# Patient Record
Sex: Male | Born: 1950 | Race: White | Hispanic: No | Marital: Married | Smoking: Never smoker
Health system: Southern US, Community
[De-identification: ages and names within clinical notes are randomized; demographics above are authoritative.]

## PROBLEM LIST (undated history)

## (undated) DIAGNOSIS — I10 Essential (primary) hypertension: Secondary | ICD-10-CM

## (undated) DIAGNOSIS — M549 Dorsalgia, unspecified: Secondary | ICD-10-CM

## (undated) DIAGNOSIS — E785 Hyperlipidemia, unspecified: Secondary | ICD-10-CM

## (undated) DIAGNOSIS — N400 Enlarged prostate without lower urinary tract symptoms: Secondary | ICD-10-CM

## (undated) DIAGNOSIS — Z973 Presence of spectacles and contact lenses: Secondary | ICD-10-CM

## (undated) DIAGNOSIS — E039 Hypothyroidism, unspecified: Secondary | ICD-10-CM

## (undated) DIAGNOSIS — J309 Allergic rhinitis, unspecified: Principal | ICD-10-CM

## (undated) DIAGNOSIS — N486 Induration penis plastica: Secondary | ICD-10-CM

## (undated) HISTORY — DX: Allergic rhinitis, unspecified: J30.9

## (undated) HISTORY — DX: Hyperlipidemia, unspecified: E78.5

## (undated) HISTORY — PX: INGUINAL HERNIA REPAIR: SUR1180

## (undated) HISTORY — DX: Essential (primary) hypertension: I10

---

## 2004-08-28 ENCOUNTER — Ambulatory Visit: Payer: Self-pay | Admitting: Family Medicine

## 2004-10-09 ENCOUNTER — Ambulatory Visit: Payer: Self-pay | Admitting: Family Medicine

## 2005-01-21 ENCOUNTER — Ambulatory Visit: Payer: Self-pay | Admitting: Family Medicine

## 2005-04-08 ENCOUNTER — Ambulatory Visit: Payer: Self-pay | Admitting: Family Medicine

## 2005-07-29 ENCOUNTER — Ambulatory Visit: Payer: Self-pay | Admitting: *Deleted

## 2005-07-29 ENCOUNTER — Ambulatory Visit: Payer: Self-pay | Admitting: Family Medicine

## 2005-11-25 ENCOUNTER — Ambulatory Visit: Payer: Self-pay | Admitting: Nurse Practitioner

## 2005-12-16 ENCOUNTER — Ambulatory Visit: Payer: Self-pay | Admitting: Family Medicine

## 2006-01-20 ENCOUNTER — Ambulatory Visit: Payer: Self-pay | Admitting: Family Medicine

## 2006-03-31 ENCOUNTER — Ambulatory Visit: Payer: Self-pay | Admitting: Family Medicine

## 2007-02-23 ENCOUNTER — Encounter (INDEPENDENT_AMBULATORY_CARE_PROVIDER_SITE_OTHER): Payer: Self-pay | Admitting: *Deleted

## 2009-08-06 LAB — HM COLONOSCOPY: HM Colonoscopy: NORMAL

## 2010-02-06 ENCOUNTER — Encounter: Payer: Self-pay | Admitting: Internal Medicine

## 2010-02-06 ENCOUNTER — Ambulatory Visit: Payer: Self-pay | Admitting: Internal Medicine

## 2010-02-06 DIAGNOSIS — N4889 Other specified disorders of penis: Secondary | ICD-10-CM

## 2010-02-06 DIAGNOSIS — I10 Essential (primary) hypertension: Secondary | ICD-10-CM | POA: Insufficient documentation

## 2010-02-06 DIAGNOSIS — E785 Hyperlipidemia, unspecified: Secondary | ICD-10-CM

## 2010-02-06 LAB — CONVERTED CEMR LAB
Albumin: 4.4 g/dL (ref 3.5–5.2)
Alkaline Phosphatase: 61 units/L (ref 39–117)
Basophils Absolute: 0 10*3/uL (ref 0.0–0.1)
Basophils Relative: 0.5 % (ref 0.0–3.0)
CO2: 30 meq/L (ref 19–32)
Chloride: 103 meq/L (ref 96–112)
Cholesterol: 169 mg/dL (ref 0–200)
Eosinophils Absolute: 0.2 10*3/uL (ref 0.0–0.7)
Glucose, Bld: 93 mg/dL (ref 70–99)
HCT: 44 % (ref 39.0–52.0)
HDL: 34.1 mg/dL — ABNORMAL LOW (ref >39.00)
Hemoglobin: 15.2 g/dL (ref 13.0–17.0)
Ketones, ur: NEGATIVE mg/dL
Leukocytes, UA: NEGATIVE
Lymphocytes Relative: 21.7 % (ref 12.0–46.0)
Lymphs Abs: 1.5 10*3/uL (ref 0.7–4.0)
MCHC: 34.7 g/dL (ref 30.0–36.0)
MCV: 89.3 fL (ref 78.0–100.0)
Neutro Abs: 4.7 10*3/uL (ref 1.4–7.7)
PSA: 0.63 ng/mL (ref 0.10–4.00)
Potassium: 4.4 meq/L (ref 3.5–5.1)
RBC: 4.92 M/uL (ref 4.22–5.81)
RDW: 13.7 % (ref 11.5–14.6)
Sodium: 141 meq/L (ref 135–145)
Specific Gravity, Urine: 1.025 (ref 1.000–1.030)
TSH: 3.33 microintl units/mL (ref 0.35–5.50)
Total Protein: 7.3 g/dL (ref 6.0–8.3)
Urine Glucose: NEGATIVE mg/dL
Urobilinogen, UA: 1 (ref 0.0–1.0)
VLDL: 36.2 mg/dL (ref 0.0–40.0)
pH: 6 (ref 5.0–8.0)

## 2010-02-14 ENCOUNTER — Encounter (INDEPENDENT_AMBULATORY_CARE_PROVIDER_SITE_OTHER): Payer: Self-pay | Admitting: *Deleted

## 2010-04-09 ENCOUNTER — Encounter: Payer: Self-pay | Admitting: Internal Medicine

## 2010-07-09 NOTE — Consult Note (Signed)
Summary: [facility]  [facility]   Imported By: Lester Edgemont 04/15/2010 08:00:45  _____________________________________________________________________  External Attachment:    Type:   Image     Comment:   External Document

## 2010-07-09 NOTE — Letter (Signed)
Summary: [facility] Consult Scheduled Letter  [facility]  [address] [place], [place] [address]   Phone: [phone]  Fax: [phone]      02/14/2010 MRN: [id number]  JAD JOHANSSON [address] [place], [place]  [address]    Dear Mr. Agnes,      We have scheduled an appointment for you.  At the recommendation of Dr.John, we have scheduled you a consult with Dr Vernie Ammons on 03/17/10 at 3:15pm.  Their phone number is [phone].  If this appointment day and time is not convenient for you, please feel free to call the office of the doctor you are being referred to at the number listed above and reschedule the appointment.    [facility] [address] [place], [place] [address]    Thank you,  Patient Care Coordinator [facility]

## 2010-07-09 NOTE — Assessment & Plan Note (Signed)
Summary: NEW PT FOR CPX/BCBS PER WIFE/#/CD   Vital Signs:  Patient profile:   60 year old male Height:      67 inches Weight:      166.50 pounds BMI:     26.17 O2 Sat:      96 % on Room air Temp:     97.7 degrees F oral Pulse rate:   68 / minute BP sitting:   115 / 80  (left arm) Cuff size:   regular  Vitals Entered By: Zella Ball Ewing CMA Duncan Dull) (February 06, 2010 8:48 AM)  O2 Flow:  Room air  Preventive Care Screening  Colonoscopy:    Date:  08/06/2009    Next Due:  08/2019    Results:  normal   Last Tetanus Booster:    Date:  02/06/2010    Results:  Tdap  CC: new Pt. Adult Physical/RE   CC:  new Pt. Adult Physical/RE.  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  Also mentions some notice of increased forgetfulness recently, but o/w cognition intact .  Overall good complaicne iwth meds, good tolerability, but does not want to take cialis anymore.   Preventive Screening-Counseling & Management  Alcohol-Tobacco     Smoking Status: never      Drug Use:  no.    Problems Prior to Update: None  Medications Prior to Update: 1)  None  Current Medications (verified): 1)  Levothyroxine Sodium 50 Mcg Tabs (Levothyroxine Sodium) .Marland Kitchen.. 1 By Mouth Once Daily 2)  Simvastatin 40 Mg Tabs (Simvastatin) .Marland Kitchen.. 1 By Mouth Once Daily 3)  Lisinopril-Hydrochlorothiazide 20-25 Mg Tabs (Lisinopril-Hydrochlorothiazide) .Marland Kitchen.. 1 By Mouth Once Daily  Allergies (verified): No Known Drug Allergies  Past History:  Family History: Last updated: 02/06/2010 mother with stroke  Social History: Last updated: 02/06/2010 Married 2 children work - self employed - gold buyer Never Smoked Alcohol use-no Drug use-no  Risk Factors: Smoking Status: never (02/06/2010)  Past Medical  History: Hyperlipidemia Hypertension  Past Surgical History: Inguinal herniorrhaphy - left as teen  Family History: Reviewed history and no changes required. mother with stroke  Social History: Reviewed history and no changes required. Married 2 children work - self employed - gold buyer Never Smoked Alcohol use-no Drug use-no Smoking Status:  never Drug Use:  no  Review of Systems  The patient denies anorexia, fever, weight loss, weight gain, vision loss, decreased hearing, hoarseness, chest pain, syncope, dyspnea on exertion, peripheral edema, prolonged cough, headaches, hemoptysis, abdominal pain, melena, hematochezia, severe indigestion/heartburn, hematuria, muscle weakness, suspicious skin lesions, transient blindness, difficulty walking, depression, unusual weight change, abnormal bleeding, enlarged lymph nodes, and angioedema.         all otherwise negative per pt -    Physical Exam  General:  alert and well-developed.   Head:  normocephalic and atraumatic.   Eyes:  vision grossly intact, pupils equal, and pupils round.   Ears:  R ear normal and L ear normal.   Nose:  no external deformity and no nasal discharge.   Mouth:  no gingival abnormalities and pharynx pink and moist.   Neck:  supple and no masses.   Lungs:  normal respiratory effort and normal breath sounds.   Heart:  normal rate and regular rhythm.   Abdomen:  soft, non-tender, and normal bowel sounds.  Genitalia:  Testes bilaterally descended without nodularity, tenderness or masses. No scrotal masses or lesions. No penis lesions or urethral discharge. Msk:  no joint tenderness and no joint swelling.   Extremities:  no edema, no erythema  Neurologic:  cranial nerves II-XII intact and strength normal in all extremities.     Impression & Recommendations:  Problem # 1:  Preventive Health Care (ICD-V70.0)  Overall doing well, age appropriate education and counseling updated and referral for appropriate  preventive services done unless declined, immunizations up to date or declined, diet counseling done if overweight, urged to quit smoking if smokes , most recent labs reviewed and current ordered if appropriate, ecg reviewed or declined (interpretation per ECG scanned in the EMR if done); information regarding Medicare Prevention requirements given if appropriate; speciality referrals updated as appropriate   Orders: EKG w/ Interpretation (93000) TLB-BMP (Basic Metabolic Panel-BMET) (80048-METABOL) TLB-CBC Platelet - w/Differential (85025-CBCD) TLB-Hepatic/Liver Function Pnl (80076-HEPATIC) TLB-Lipid Panel (80061-LIPID) TLB-TSH (Thyroid Stimulating Hormone) (84443-TSH) TLB-PSA (Prostate Specific Antigen) (84153-PSA) TLB-Udip ONLY (81003-UDIP)  Problem # 2:  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 1 by mouth once daily 2)  Simvastatin 40 Mg Tabs (Simvastatin) .Marland Kitchen.. 1 by mouth once daily 3)  Lisinopril-hydrochlorothiazide 20-25 Mg Tabs (Lisinopril-hydrochlorothiazide) .Marland Kitchen.. 1 by mouth once daily 4)  Aspir-low 81 Mg Tbec (Aspirin) .Marland Kitchen.. 1po once daily  Other Orders: Tdap => 72yrs IM (08657) Admin 1st Vaccine (84696) Admin 1st Vaccine (29528) Flu Vaccine 68yrs + [id number])  Patient Instructions: 1)  you had the tetanus shot today, and the flu shot today 2)  Please go to the Lab in the basement for your blood and/or urine tests today 3)  Please call the number on the [facility] Card for results of your testing 4)  You will be contacted about the referral(s) to: Urology 5)  OK to stop the cialis 6)  You are given the refills today 7)  Please schedule a follow-up appointment in 1 year, or sooner if needed 8)  Take an Aspirin every day - 81 mg -1 per day - COATED  only Prescriptions: LISINOPRIL-HYDROCHLOROTHIAZIDE 20-25 MG TABS (LISINOPRIL-HYDROCHLOROTHIAZIDE) 1 by mouth once daily  #90 x 3   Entered and Authorized by:   Corwin Levins MD   Signed by:   Corwin Levins MD on 02/06/2010   Method used:   Print then Give to Patient   RxID:   [id number] SIMVASTATIN 40 MG TABS (SIMVASTATIN) 1 by mouth once daily  #90 x 3   Entered and Authorized by:   Corwin Levins MD   Signed by:   Corwin Levins MD on 02/06/2010   Method used:   Print then Give to Patient   RxID:   [id number] LEVOTHYROXINE SODIUM 50 MCG TABS (LEVOTHYROXINE SODIUM) 1 by mouth once daily  #90 x 3   Entered and Authorized by:   Corwin Levins MD   Signed by:   Corwin Levins MD on 02/06/2010   Method used:   Print then Give to Patient   RxID:   [id number]    Immunizations Administered:  Tetanus Vaccine:    Vaccine Type: Tdap    Site: left deltoid    Mfr: GlaxoSmithKline    Dose: 0.5 ml    Route: IM    Given by: Scharlene Gloss CMA (  AAMA)    Exp. Date: 12/06/2011    Lot #: [id number]    VIS given: 04/25/08 version given February 06, 2010.     Flu Vaccine Consent Questions     Do you have a history of severe allergic reactions to this vaccine? no    Any prior history of allergic reactions to egg and/or gelatin? no    Do you have a sensitivity to the preservative Thimersol? no    Do you have a past history of Guillan-Barre Syndrome? no    Do you currently have an acute febrile illness? no    Have you ever had a severe reaction to latex? no    Vaccine information given and explained to patient? yes    Are you currently pregnant? no    Lot Number:[id number]   Exp Date:12/06/2010   Site Given  Left Deltoid IMu

## 2011-03-26 ENCOUNTER — Other Ambulatory Visit: Payer: Self-pay | Admitting: Internal Medicine

## 2011-03-26 DIAGNOSIS — Z0000 Encounter for general adult medical examination without abnormal findings: Secondary | ICD-10-CM | POA: Insufficient documentation

## 2011-04-01 ENCOUNTER — Encounter: Payer: Self-pay | Admitting: Internal Medicine

## 2011-04-01 ENCOUNTER — Ambulatory Visit (INDEPENDENT_AMBULATORY_CARE_PROVIDER_SITE_OTHER): Payer: BC Managed Care – PPO | Admitting: Internal Medicine

## 2011-04-01 ENCOUNTER — Other Ambulatory Visit (INDEPENDENT_AMBULATORY_CARE_PROVIDER_SITE_OTHER): Payer: BC Managed Care – PPO

## 2011-04-01 ENCOUNTER — Other Ambulatory Visit: Payer: Self-pay | Admitting: Internal Medicine

## 2011-04-01 VITALS — BP 120/80 | HR 70 | Temp 97.5°F | Ht 65.0 in | Wt 168.4 lb

## 2011-04-01 DIAGNOSIS — R21 Rash and other nonspecific skin eruption: Secondary | ICD-10-CM

## 2011-04-01 DIAGNOSIS — Z0000 Encounter for general adult medical examination without abnormal findings: Secondary | ICD-10-CM

## 2011-04-01 DIAGNOSIS — Z23 Encounter for immunization: Secondary | ICD-10-CM

## 2011-04-01 LAB — CBC WITH DIFFERENTIAL/PLATELET
Basophils Absolute: 0 10*3/uL (ref 0.0–0.1)
Hemoglobin: 14.7 g/dL (ref 13.0–17.0)
Lymphocytes Relative: 27.2 % (ref 12.0–46.0)
Monocytes Relative: 9.5 % (ref 3.0–12.0)
Neutro Abs: 3.4 10*3/uL (ref 1.4–7.7)
RDW: 13.6 % (ref 11.5–14.6)
WBC: 5.7 10*3/uL (ref 4.5–10.5)

## 2011-04-01 LAB — BASIC METABOLIC PANEL
BUN: 16 mg/dL (ref 6–23)
CO2: 29 mEq/L (ref 19–32)
Calcium: 9.3 mg/dL (ref 8.4–10.5)
Creatinine, Ser: 0.8 mg/dL (ref 0.4–1.5)
Glucose, Bld: 91 mg/dL (ref 70–99)
Sodium: 141 mEq/L (ref 135–145)

## 2011-04-01 LAB — LIPID PANEL
Cholesterol: 241 mg/dL — ABNORMAL HIGH (ref 0–200)
Triglycerides: 154 mg/dL — ABNORMAL HIGH (ref 0.0–149.0)

## 2011-04-01 LAB — URINALYSIS, ROUTINE W REFLEX MICROSCOPIC
Leukocytes, UA: NEGATIVE
Specific Gravity, Urine: 1.015 (ref 1.000–1.030)
Urine Glucose: NEGATIVE
Urobilinogen, UA: 0.2 (ref 0.0–1.0)

## 2011-04-01 LAB — HEPATIC FUNCTION PANEL
Albumin: 4.2 g/dL (ref 3.5–5.2)
Alkaline Phosphatase: 59 U/L (ref 39–117)
Total Protein: 6.9 g/dL (ref 6.0–8.3)

## 2011-04-01 LAB — LDL CHOLESTEROL, DIRECT: Direct LDL: 178.1 mg/dL

## 2011-04-01 LAB — TSH: TSH: 3.49 u[IU]/mL (ref 0.35–5.50)

## 2011-04-01 MED ORDER — LEVOTHYROXINE SODIUM 50 MCG PO TABS: ORAL_TABLET | ORAL | Status: DC

## 2011-04-01 MED ORDER — CLOTRIMAZOLE-BETAMETHASONE 1-0.05 % EX CREA: TOPICAL_CREAM | CUTANEOUS | Status: DC

## 2011-04-01 MED ORDER — LISINOPRIL-HYDROCHLOROTHIAZIDE 20-25 MG PO TABS: 1.0000 | ORAL_TABLET | Freq: Every day | ORAL | Status: DC

## 2011-04-01 MED ORDER — SIMVASTATIN 40 MG PO TABS: 40.0000 mg | ORAL_TABLET | Freq: Every day | ORAL | Status: DC

## 2011-04-01 NOTE — Assessment & Plan Note (Signed)

## 2011-04-01 NOTE — Patient Instructions (Addendum)
You had the flu shot today Please call the phone number [phone] (the PhoneTree System) for results of testing in 2-3 days;  When calling, simply dial the number, and when prompted enter the MRN number above (the Medical Record Number) and the # key, then the message should start. Take all new medications as prescribed - the cream

## 2011-04-05 ENCOUNTER — Encounter: Payer: Self-pay | Admitting: Internal Medicine

## 2011-04-05 NOTE — Assessment & Plan Note (Signed)
For lotrisone asd prn 

## 2011-04-05 NOTE — Progress Notes (Signed)
Subjective:    Patient ID: Peter Clark, male    DOB: [birth year], 60 y.o.   MRN: [id number]  HPI  Here for wellness and f/u;  Overall doing ok;  Pt denies CP, worsening SOB, DOE, wheezing, orthopnea, PND, worsening LE edema, palpitations, dizziness or syncope.  Pt denies neurological change such as new Headache, facial or extremity weakness.  Pt denies polydipsia, polyuria, or low sugar symptoms. Pt states overall good compliance with treatment and medications, good tolerability, and trying to follow lower cholesterol diet.  Pt denies worsening depressive symptoms, suicidal ideation or panic. No fever, wt loss, night sweats, loss of appetite, or other constitutional symptoms.  Pt states good ability with ADL's, low fall risk, home safety reviewed and adequate, no significant changes in hearing or vision, and occasionally active with exercise.  No acute complaints except for whitish rash between 5th and 4th toes right foot.   Past Medical History  Diagnosis Date  . Hyperlipidemia   . Hypertension    Past Surgical History  Procedure Date  . Hernia repair     Inguinal LT as a teen    reports that he has never smoked. He does not have any smokeless tobacco history on file. He reports that he does not drink alcohol or use illicit drugs. family history includes Stroke in his mother. No Known Allergies Current Outpatient Prescriptions on File Prior to Visit  Medication Sig Dispense Refill  . aspirin 81 MG tablet Take 81 mg by mouth daily.         Review of Systems Review of Systems  Constitutional: Negative for diaphoresis, activity change, appetite change and unexpected weight change.  HENT: Negative for hearing loss, ear pain, facial swelling, mouth sores and neck stiffness.   Eyes: Negative for pain, redness and visual disturbance.  Respiratory: Negative for shortness of breath and wheezing.   Cardiovascular: Negative for chest pain and palpitations.  Gastrointestinal: Negative for  diarrhea, blood in stool, abdominal distention and rectal pain.  Genitourinary: Negative for hematuria, flank pain and decreased urine volume.  Musculoskeletal: Negative for myalgias and joint swelling.  Skin: Negative for color change and wound.  Neurological: Negative for syncope and numbness.  Hematological: Negative for adenopathy.  Psychiatric/Behavioral: Negative for hallucinations, self-injury, decreased concentration and agitation.      Objective:   Physical Exam BP 120/80  Pulse 70  Temp(Src) 97.5 F (36.4 C) (Oral)  Ht 5\' 5"  (1.651 m)  Wt 168 lb 6 oz (76.374 kg)  BMI 28.02 kg/m2  SpO2 95% Physical Exam  VS noted Constitutional: Pt is oriented to person, place, and time. Appears well-developed and well-nourished.  HENT:  Head: Normocephalic and atraumatic.  Right Ear: External ear normal.  Left Ear: External ear normal.  Nose: Nose normal.  Mouth/Throat: Oropharynx is clear and moist.  Eyes: Conjunctivae and EOM are normal. Pupils are equal, round, and reactive to light.  Neck: Normal range of motion. Neck supple. No JVD present. No tracheal deviation present.  Cardiovascular: Normal rate, regular rhythm, normal heart sounds and intact distal pulses.   Pulmonary/Chest: Effort normal and breath sounds normal.  Abdominal: Soft. Bowel sounds are normal. There is no tenderness.  Musculoskeletal: Normal range of motion. Exhibits no edema.  Lymphadenopathy:  Has no cervical adenopathy.  Neurological: Pt is alert and oriented to person, place, and time. Pt has normal reflexes. No cranial nerve deficit.  Skin: Skin is warm and dry. No rash noted. except for small area whitish rash c/w fungal between 5th  and 4th toes Psychiatric:  Has  normal mood and affect. Behavior is normal.     Assessment & Plan:

## 2012-04-01 ENCOUNTER — Other Ambulatory Visit (INDEPENDENT_AMBULATORY_CARE_PROVIDER_SITE_OTHER): Payer: BC Managed Care – PPO

## 2012-04-01 ENCOUNTER — Encounter: Payer: Self-pay | Admitting: Internal Medicine

## 2012-04-01 ENCOUNTER — Ambulatory Visit (INDEPENDENT_AMBULATORY_CARE_PROVIDER_SITE_OTHER): Payer: BC Managed Care – PPO | Admitting: Internal Medicine

## 2012-04-01 VITALS — BP 102/70 | HR 76 | Temp 98.5°F | Ht 66.0 in | Wt 163.5 lb

## 2012-04-01 DIAGNOSIS — Z0000 Encounter for general adult medical examination without abnormal findings: Secondary | ICD-10-CM

## 2012-04-01 DIAGNOSIS — R21 Rash and other nonspecific skin eruption: Secondary | ICD-10-CM

## 2012-04-01 DIAGNOSIS — R079 Chest pain, unspecified: Secondary | ICD-10-CM

## 2012-04-01 DIAGNOSIS — Z23 Encounter for immunization: Secondary | ICD-10-CM

## 2012-04-01 DIAGNOSIS — N400 Enlarged prostate without lower urinary tract symptoms: Secondary | ICD-10-CM

## 2012-04-01 LAB — CBC WITH DIFFERENTIAL/PLATELET
Basophils Absolute: 0 10*3/uL (ref 0.0–0.1)
Eosinophils Absolute: 0.2 10*3/uL (ref 0.0–0.7)
HCT: 45.1 % (ref 39.0–52.0)
Hemoglobin: 15 g/dL (ref 13.0–17.0)
Lymphs Abs: 1.6 10*3/uL (ref 0.7–4.0)
MCHC: 33.3 g/dL (ref 30.0–36.0)
Monocytes Relative: 8.1 % (ref 3.0–12.0)
Neutro Abs: 5 10*3/uL (ref 1.4–7.7)
Platelets: 156 10*3/uL (ref 150.0–400.0)
RDW: 13.1 % (ref 11.5–14.6)

## 2012-04-01 LAB — URINALYSIS, ROUTINE W REFLEX MICROSCOPIC
Bilirubin Urine: NEGATIVE
Leukocytes, UA: NEGATIVE
Nitrite: NEGATIVE
Specific Gravity, Urine: 1.01 (ref 1.000–1.030)
Urobilinogen, UA: 0.2 (ref 0.0–1.0)
pH: 6 (ref 5.0–8.0)

## 2012-04-01 LAB — BASIC METABOLIC PANEL
CO2: 28 mEq/L (ref 19–32)
Chloride: 104 mEq/L (ref 96–112)
Glucose, Bld: 84 mg/dL (ref 70–99)
Potassium: 3.8 mEq/L (ref 3.5–5.1)
Sodium: 139 mEq/L (ref 135–145)

## 2012-04-01 LAB — LDL CHOLESTEROL, DIRECT: Direct LDL: 99.8 mg/dL

## 2012-04-01 LAB — HEPATIC FUNCTION PANEL
AST: 30 U/L (ref 0–37)
Albumin: 3.7 g/dL (ref 3.5–5.2)
Total Bilirubin: 0.9 mg/dL (ref 0.3–1.2)

## 2012-04-01 LAB — LIPID PANEL
HDL: 31.1 mg/dL — ABNORMAL LOW (ref >39.00)
Total CHOL/HDL Ratio: 5
Triglycerides: 300 mg/dL — ABNORMAL HIGH (ref 0.0–149.0)
VLDL: 60 mg/dL — ABNORMAL HIGH (ref 0.0–40.0)

## 2012-04-01 LAB — PSA: PSA: 0.64 ng/mL (ref 0.10–4.00)

## 2012-04-01 LAB — TSH: TSH: 3.69 u[IU]/mL (ref 0.35–5.50)

## 2012-04-01 MED ORDER — CLOTRIMAZOLE-BETAMETHASONE 1-0.05 % EX CREA: TOPICAL_CREAM | CUTANEOUS | Status: AC

## 2012-04-01 MED ORDER — TAMSULOSIN HCL 0.4 MG PO CAPS: 0.4000 mg | ORAL_CAPSULE | Freq: Every day | ORAL | Status: DC

## 2012-04-01 MED ORDER — LEVOTHYROXINE SODIUM 50 MCG PO TABS: ORAL_TABLET | ORAL | Status: DC

## 2012-04-01 MED ORDER — SIMVASTATIN 40 MG PO TABS: 40.0000 mg | ORAL_TABLET | Freq: Every day | ORAL | Status: DC

## 2012-04-01 MED ORDER — LISINOPRIL-HYDROCHLOROTHIAZIDE 20-25 MG PO TABS: 1.0000 | ORAL_TABLET | Freq: Every day | ORAL | Status: DC

## 2012-04-01 NOTE — Patient Instructions (Addendum)
You had the flu shot today Take all new medications as prescribed - the generic flomax for the urination Continue all other medications as before; your medications were all refilled today Please have the pharmacy call with any other refills you may need in the future Please consider the Shingles shot if covered by insurance Your blood tests were drawn today You will be contacted by phone if any changes need to be made immediately.  Otherwise, you will receive a letter about your results with an explanation. Please remember to sign up for My Chart at your earliest convenience, as this will be important to you in the future with finding out test results. Please call if you change your mind about having the stress test or Cardiology referral Please return in 1 year for your yearly visit, or sooner if needed, with Lab testing done 3-5 days before

## 2012-04-01 NOTE — Assessment & Plan Note (Signed)

## 2012-04-02 ENCOUNTER — Encounter: Payer: Self-pay | Admitting: Internal Medicine

## 2012-04-02 DIAGNOSIS — R079 Chest pain, unspecified: Secondary | ICD-10-CM | POA: Insufficient documentation

## 2012-04-02 NOTE — Assessment & Plan Note (Addendum)
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

## 2012-04-02 NOTE — Progress Notes (Signed)
Subjective:    Patient ID: Peter Clark, male    DOB: Aug 26, 1950, 61 y.o.   MRN: [id number]  HPI  Here for wellness and f/u;  Overall doing ok;  Pt denies worsening SOB, DOE, wheezing, orthopnea, PND, worsening LE edema, palpitations, dizziness or syncope, but has had several months of exertional anterior CP with radiation to the left arm.  Pt denies neurological change such as new Headache, facial or extremity weakness.  Pt denies polydipsia, polyuria, or low sugar symptoms. Pt states overall good compliance with treatment and medications, good tolerability, and trying to follow lower cholesterol diet.  Pt denies worsening depressive symptoms, suicidal ideation or panic. No fever, wt loss, night sweats, loss of appetite, or other constitutional symptoms.  Pt states good ability with ADL's, low fall risk, home safety reviewed and adequate, no significant changes in hearing or vision, and occasionally active with exercise.  Has several family members with hx of CV issues in early 23's - heart disease and stroke.  Does have new midl prostatism symtpoms or weaker stream, mild inability to completely urinate, nocuturia x 2 Past Medical History  Diagnosis Date  . Hyperlipidemia   . Hypertension    Past Surgical History  Procedure Date  . Hernia repair     Inguinal LT as a teen    reports that he has never smoked. He does not have any smokeless tobacco history on file. He reports that he does not drink alcohol or use illicit drugs. family history includes Stroke in his mother. No Known Allergies Current Outpatient Prescriptions on File Prior to Visit  Medication Sig Dispense Refill  . aspirin 81 MG tablet Take 81 mg by mouth daily.        Marland Kitchen levothyroxine (SYNTHROID, LEVOTHROID) 50 MCG tablet 1/2 tab by mouth daily  45 tablet  3  . simvastatin (ZOCOR) 40 MG tablet Take 1 tablet (40 mg total) by mouth at bedtime.  90 tablet  3  . lisinopril-hydrochlorothiazide (PRINZIDE,ZESTORETIC) 20-25 MG per  tablet Take 1 tablet by mouth daily.  90 tablet  3   Review of Systems Review of Systems  Constitutional: Negative for diaphoresis, activity change, appetite change and unexpected weight change.  HENT: Negative for hearing loss, ear pain, facial swelling, mouth sores and neck stiffness.   Eyes: Negative for pain, redness and visual disturbance.  Respiratory: Negative for shortness of breath and wheezing.   Cardiovascular: Negative for chest pain and palpitations.  Gastrointestinal: Negative for diarrhea, blood in stool, abdominal distention and rectal pain.  Genitourinary: Negative for hematuria, flank pain and decreased urine volume.  Musculoskeletal: Negative for myalgias and joint swelling.  Skin: Negative for color change and wound.  Neurological: Negative for syncope and numbness.  Hematological: Negative for adenopathy.  Psychiatric/Behavioral: Negative for hallucinations, self-injury, decreased concentration and agitation.      Objective:   Physical Exam BP 102/70  Pulse 76  Temp 98.5 F (36.9 C) (Oral)  Ht 5\' 6"  (1.676 m)  Wt 163 lb 8 oz (74.163 kg)  BMI 26.39 kg/m2  SpO2 97% Physical Exam  VS noted Constitutional: Pt is oriented to person, place, and time. Appears well-developed and well-nourished.  HENT:  Head: Normocephalic and atraumatic.  Right Ear: External ear normal.  Left Ear: External ear normal.  Nose: Nose normal.  Mouth/Throat: Oropharynx is clear and moist.  Eyes: Conjunctivae and EOM are normal. Pupils are equal, round, and reactive to light.  Neck: Normal range of motion. Neck supple. No JVD present. No  tracheal deviation present.  Cardiovascular: Normal rate, regular rhythm, normal heart sounds and intact distal pulses.   Pulmonary/Chest: Effort normal and breath sounds normal.  Abdominal: Soft. Bowel sounds are normal. There is no tenderness.  Musculoskeletal: Normal range of motion. Exhibits no edema.  Lymphadenopathy:  Has no cervical adenopathy.    Neurological: Pt is alert and oriented to person, place, and time. Pt has normal reflexes. No cranial nerve deficit.  Skin: Skin is warm and dry. No rash noted.  Psychiatric:  Has  normal mood and affect. Behavior is normal.     Assessment & Plan:

## 2012-04-02 NOTE — Assessment & Plan Note (Signed)
For flomax asd, consider urology but declines

## 2012-04-02 NOTE — Assessment & Plan Note (Signed)
Recurrent between toes, for lotrisone refill

## 2012-04-08 ENCOUNTER — Telehealth: Payer: Self-pay

## 2012-04-08 NOTE — Telephone Encounter (Signed)
Unable to contact the patient.  Pharmacy sent fax requesting less expensive generic.

## 2012-04-08 NOTE — Telephone Encounter (Signed)
The patient called to inform that Tamsulosin was $90 (too expensive for him) at [facility].   Please send in alternative to [facility] in [place].

## 2012-04-08 NOTE — Telephone Encounter (Signed)
Called the patient left message to call back 

## 2012-04-08 NOTE — Telephone Encounter (Signed)
Called left message to call back 

## 2012-04-08 NOTE — Telephone Encounter (Signed)
Please consider [facility] - (you dont have to be a member to use the pharmacy)  tamsulosin price online is:  $26.89 for #100  Please encourage pt to check online or in person at [facility] as well  I agree $90 is too much;  This is a generic medication and other med such as generic Cardura may be less expensive, but with more chance of side effect

## 2012-04-08 NOTE — Telephone Encounter (Signed)
No, we need to contact pt regarding less expensive pharmacy

## 2012-04-11 NOTE — Telephone Encounter (Signed)
Called the patient informed of MD instructions on Medication.  The patient understood and agreed to shop around at both [facility] and [facility] to get the best price.  Will contact us once he completes instructions.

## 2012-04-13 ENCOUNTER — Telehealth: Payer: Self-pay

## 2012-04-13 MED ORDER — TAMSULOSIN HCL 0.4 MG PO CAPS: 0.4000 mg | ORAL_CAPSULE | Freq: Every day | ORAL | Status: DC

## 2012-04-13 NOTE — Telephone Encounter (Signed)
The patient did shop around for a better price for his Tamsulosin.  He called this am to inform he can get it at [facility] for $26 and requesting to send there.  Informed would do

## 2013-09-04 ENCOUNTER — Other Ambulatory Visit: Payer: Self-pay | Admitting: Urology

## 2013-09-11 ENCOUNTER — Encounter (HOSPITAL_BASED_OUTPATIENT_CLINIC_OR_DEPARTMENT_OTHER): Payer: Self-pay | Admitting: *Deleted

## 2013-09-11 NOTE — Progress Notes (Signed)
SPOKE W/ PT AND WIFE.  NPO AFTER MN. ARRIVE AT 0600. NEEDS ISTAT AND EKG.

## 2013-09-17 NOTE — H&P (Signed)
Peter Clark is a 63 year old male patient of Dr. 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   History of Present Illness Peyronie's disease: He was initially evaluated in 11/11 for a lump within the penis that he stated had been present for about a year. He did not recall any specific trauma and has no history of Dupuytren's contracture. He said the area was not tender or painful and he had no discomfort with erections. What he describes is curvature of the penis of approximately 90 to the left with erections. The curvature is significant enough that it makes intercourse extremely difficult although he is still able to achieve a good firm erection. He apparently saw a urologist in [place] who recommended Cialis for 6 months but the patient noted no improvement on this medication.     Interval history: Since I have seen him last he indicates he continues to have the same degree of curvature of his penis to the left. He does still achieve a satisfactory erection. He wanted to discuss the options once again.   Past Medical History Problems  1. History of hypercholesterolemia (V12.29) 2. History of hypertension (V12.59)  Surgical History Problems  1. History of No Surgical Problems  Current Meds 1. Aspirin 81 MG Oral Tablet;  Therapy: (Recorded:19Mar2015) to Recorded 2. Levothyroxine Sodium TABS;  Therapy: (Recorded:19Mar2015) to Recorded 3. Lisinopril-Hydrochlorothiazide TABS;  Therapy: (Recorded:19Mar2015) to Recorded 4. Lovastatin TABS;  Therapy: (Recorded:19Mar2015) to Recorded  Allergies Medication  1. No Known Drug Allergies  Family History Problems  1. Family history of Family Health Status Children ___ Living Daughters   1 2. Family history of Family Health Status Children ___ Living Sons   1 3. Family history of stroke (V17.1) : Mother  Social History Problems  1. Denied: History of Alcohol Use 2. Caffeine Use   1 tea 3. Marital  History - Currently Married 4. Never smoker 5. Denied: History of Tobacco Use  Review of Systems Genitourinary, constitutional, skin, eye, otolaryngeal, hematologic/lymphatic, cardiovascular, pulmonary, endocrine, musculoskeletal, gastrointestinal, neurological and psychiatric system(s) were reviewed and pertinent findings if present are noted.  Genitourinary: penile curvature.    Vitals Vital Signs  Height: 5 ft 6 in Weight: 168 lb  BMI Calculated: 27.12 BSA Calculated: 1.86 Blood Pressure: 115 / 76 Heart Rate: 71  Physical Exam Constitutional: Well nourished and well developed . No acute distress.  ENT:. The ears and nose are normal in appearance.  Neck: The appearance of the neck is normal and no neck mass is present.  Pulmonary: No respiratory distress and normal respiratory rhythm and effort.  Cardiovascular: Heart rate and rhythm are normal . No peripheral edema.  Abdomen: The abdomen is soft and nontender. No masses are palpated. No CVA tenderness. No hernias are palpable. No hepatosplenomegaly noted.  Genitourinary: Examination of the penis demonstrates no discharge, no masses, no lesions and a normal meatus. The penis is circumcised. Penile lesion: A single 1.5 cm plaque(s) noted. Located on the left side and mid shaft of the penis. The scrotum is without lesions. The right epididymis is palpably normal and non-tender. The left epididymis is palpably normal and non-tender. The right testis is non-tender and without masses. The left testis is non-tender and without masses.  Lymphatics: The femoral and inguinal nodes are not enlarged or tender.  Skin: Normal skin turgor, no visible rash and no visible skin lesions.  Neuro/Psych:. Mood and affect are appropriate.    Results/Data Urine  COLOR YELLOW   APPEARANCE CLEAR  SPECIFIC GRAVITY 1.025   pH 6.0   GLUCOSE NEG mg/dL  BILIRUBIN NEG   KETONE NEG mg/dL  BLOOD NEG   PROTEIN NEG mg/dL  UROBILINOGEN 0.2 mg/dL  NITRITE NEG    LEUKOCYTE ESTERASE NEG    Old records or history reviewed: Notes from Dr. Andrey Campanile as above.  The following clinical lab reports were reviewed:  UA: Clear.    Assessment He clearly has Peyronie's disease and with the degree of curvature this is interfering with intercourse. He does still achieve a good erection and therefore we discussed the treatment options which would include plaque incision and patch grafting, plication procedure and penile prosthesis. I told him that because he is still able to achieve an erection I would not recommend a prosthesis and in his situation I think he would be best served with a plication procedure due to the increased risk of erectile dysfunction that can occur with the plaque incision procedure. He does understand that this procedure would foreshorten the penis on the contralateral side and that this disease has resulted in some foreshortening of his penis on the left-hand side. He could therefore experienced some foreshortening of the penis with this procedure. I went over the procedure in detail with him as well as the risks and complications. In addition I have answered all of his questions to his satisfaction. He has elected to proceed.   Plan   He will be scheduled for a 16-Dot plication procedure.

## 2013-09-17 NOTE — Anesthesia Preprocedure Evaluation (Signed)
Anesthesia Evaluation  Patient identified by MRN, date of birth, ID band Patient awake    Reviewed: Allergy & Precautions, H&P , NPO status , Patient's Chart, lab work & pertinent test results  Airway Mallampati: II TM Distance: >3 FB Neck ROM: Full    Dental  (+) Dental Advisory Given   Pulmonary former smoker,  breath sounds clear to auscultation        Cardiovascular hypertension, Pt. on medications Rhythm:Regular Rate:Normal     Neuro/Psych negative neurological ROS  negative psych ROS   GI/Hepatic negative GI ROS, Neg liver ROS,   Endo/Other  Hypothyroidism   Renal/GU negative Renal ROS     Musculoskeletal negative musculoskeletal ROS (+)   Abdominal   Peds  Hematology negative hematology ROS (+)   Anesthesia Other Findings   Reproductive/Obstetrics negative OB ROS                           Anesthesia Physical Anesthesia Plan  ASA: II  Anesthesia Plan: General   Post-op Pain Management:    Induction: Intravenous  Airway Management Planned: LMA  Additional Equipment:   Intra-op Plan:   Post-operative Plan: Extubation in OR  Informed Consent: I have reviewed the patients History and Physical, chart, labs and discussed the procedure including the risks, benefits and alternatives for the proposed anesthesia with the patient or authorized representative who has indicated his/her understanding and acceptance.   Dental advisory given  Plan Discussed with: CRNA  Anesthesia Plan Comments:         Anesthesia Quick Evaluation

## 2013-09-18 ENCOUNTER — Ambulatory Visit (HOSPITAL_BASED_OUTPATIENT_CLINIC_OR_DEPARTMENT_OTHER)
Admission: RE | Admit: 2013-09-18 | Discharge: 2013-09-18 | Disposition: A | Discharge status: Home / Self Care | Payer: BC Managed Care – PPO | Source: Ambulatory Visit | Attending: Urology | Admitting: Urology

## 2013-09-18 ENCOUNTER — Encounter (HOSPITAL_BASED_OUTPATIENT_CLINIC_OR_DEPARTMENT_OTHER)
Admission: RE | Disposition: A | Discharge status: Home / Self Care | Payer: Self-pay | Source: Ambulatory Visit | Attending: Urology

## 2013-09-18 ENCOUNTER — Encounter (HOSPITAL_BASED_OUTPATIENT_CLINIC_OR_DEPARTMENT_OTHER): Payer: BC Managed Care – PPO | Admitting: Anesthesiology

## 2013-09-18 ENCOUNTER — Ambulatory Visit (HOSPITAL_BASED_OUTPATIENT_CLINIC_OR_DEPARTMENT_OTHER): Payer: BC Managed Care – PPO | Admitting: Anesthesiology

## 2013-09-18 ENCOUNTER — Encounter (HOSPITAL_BASED_OUTPATIENT_CLINIC_OR_DEPARTMENT_OTHER): Payer: Self-pay | Admitting: *Deleted

## 2013-09-18 ENCOUNTER — Other Ambulatory Visit: Payer: Self-pay

## 2013-09-18 DIAGNOSIS — E7800 Pure hypercholesterolemia, unspecified: Secondary | ICD-10-CM | POA: Insufficient documentation

## 2013-09-18 DIAGNOSIS — Q5569 Other congenital malformation of penis: Secondary | ICD-10-CM | POA: Insufficient documentation

## 2013-09-18 DIAGNOSIS — I10 Essential (primary) hypertension: Secondary | ICD-10-CM | POA: Insufficient documentation

## 2013-09-18 DIAGNOSIS — Z87891 Personal history of nicotine dependence: Secondary | ICD-10-CM | POA: Insufficient documentation

## 2013-09-18 DIAGNOSIS — Z7982 Long term (current) use of aspirin: Secondary | ICD-10-CM | POA: Insufficient documentation

## 2013-09-18 DIAGNOSIS — N486 Induration penis plastica: Secondary | ICD-10-CM | POA: Insufficient documentation

## 2013-09-18 DIAGNOSIS — Z823 Family history of stroke: Secondary | ICD-10-CM | POA: Insufficient documentation

## 2013-09-18 DIAGNOSIS — N4889 Other specified disorders of penis: Secondary | ICD-10-CM

## 2013-09-18 DIAGNOSIS — E039 Hypothyroidism, unspecified: Secondary | ICD-10-CM | POA: Insufficient documentation

## 2013-09-18 DIAGNOSIS — Z79899 Other long term (current) drug therapy: Secondary | ICD-10-CM | POA: Insufficient documentation

## 2013-09-18 HISTORY — DX: Presence of spectacles and contact lenses: Z97.3

## 2013-09-18 HISTORY — DX: Hypothyroidism, unspecified: E03.9

## 2013-09-18 HISTORY — DX: Dorsalgia, unspecified: M54.9

## 2013-09-18 HISTORY — PX: NESBIT PROCEDURE: SHX2087

## 2013-09-18 HISTORY — DX: Benign prostatic hyperplasia without lower urinary tract symptoms: N40.0

## 2013-09-18 HISTORY — DX: Induration penis plastica: N48.6

## 2013-09-18 LAB — POCT I-STAT, CHEM 8
BUN: 18 mg/dL (ref 6–23)
Calcium, Ion: 1.27 mmol/L (ref 1.13–1.30)
Chloride: 101 mEq/L (ref 96–112)
Creatinine, Ser: 1.2 mg/dL (ref 0.50–1.35)
Glucose, Bld: 105 mg/dL — ABNORMAL HIGH (ref 70–99)
HCT: 42 % (ref 39.0–52.0)
Hemoglobin: 14.3 g/dL (ref 13.0–17.0)
Potassium: 3.3 mEq/L — ABNORMAL LOW (ref 3.7–5.3)
Sodium: 144 mEq/L (ref 137–147)
TCO2: 28 mmol/L (ref 0–100)

## 2013-09-18 SURGERY — NESBIT PROCEDURE
Anesthesia: General | Site: Penis

## 2013-09-18 MED ORDER — STERILE WATER FOR IRRIGATION IR SOLN: Status: DC | PRN

## 2013-09-18 MED ORDER — MIDAZOLAM HCL 2 MG/2ML IJ SOLN
INTRAMUSCULAR | Status: AC
  Filled 2013-09-18: qty 2

## 2013-09-18 MED ORDER — CEFAZOLIN SODIUM-DEXTROSE 2-3 GM-% IV SOLR
2.0000 g | INTRAVENOUS | Status: AC
  Administered 2013-09-18: 2 g via INTRAVENOUS
  Filled 2013-09-18: qty 50

## 2013-09-18 MED ORDER — PROPOFOL 10 MG/ML IV BOLUS
INTRAVENOUS | Status: DC | PRN
  Administered 2013-09-18: 30 mg via INTRAVENOUS
  Administered 2013-09-18: 150 mg via INTRAVENOUS

## 2013-09-18 MED ORDER — FENTANYL CITRATE 0.05 MG/ML IJ SOLN
INTRAMUSCULAR | Status: AC
  Filled 2013-09-18: qty 6

## 2013-09-18 MED ORDER — PHENYLEPHRINE HCL 10 MG/ML IJ SOLN
INTRAMUSCULAR | Status: DC | PRN
  Administered 2013-09-18 (×3): 40 ug via INTRAVENOUS

## 2013-09-18 MED ORDER — FENTANYL CITRATE 0.05 MG/ML IJ SOLN
INTRAMUSCULAR | Status: DC | PRN
  Administered 2013-09-18 (×3): 25 ug via INTRAVENOUS
  Administered 2013-09-18: 50 ug via INTRAVENOUS
  Administered 2013-09-18: 25 ug via INTRAVENOUS

## 2013-09-18 MED ORDER — LIDOCAINE HCL (CARDIAC) 20 MG/ML IV SOLN
INTRAVENOUS | Status: DC | PRN
  Administered 2013-09-18: 60 mg via INTRAVENOUS

## 2013-09-18 MED ORDER — ACETAMINOPHEN 10 MG/ML IV SOLN
INTRAVENOUS | Status: DC | PRN
  Administered 2013-09-18: 1000 mg via INTRAVENOUS

## 2013-09-18 MED ORDER — PAPAVERINE HCL 30 MG/ML IJ SOLN
INTRAMUSCULAR | Status: DC | PRN
  Administered 2013-09-18: 120 mg via INTRAVENOUS

## 2013-09-18 MED ORDER — BUPIVACAINE HCL (PF) 0.5 % IJ SOLN
INTRAMUSCULAR | Status: DC | PRN
  Administered 2013-09-18: 10 mL

## 2013-09-18 MED ORDER — DEXAMETHASONE SODIUM PHOSPHATE 4 MG/ML IJ SOLN
INTRAMUSCULAR | Status: DC | PRN
  Administered 2013-09-18: 10 mg via INTRAVENOUS

## 2013-09-18 MED ORDER — OXYCODONE-ACETAMINOPHEN 10-325 MG PO TABS
1.0000 | ORAL_TABLET | ORAL | Status: DC | PRN
Start: 2013-09-18 — End: 2013-10-03

## 2013-09-18 MED ORDER — SODIUM CHLORIDE 0.9 % IJ SOLN
INTRAMUSCULAR | Status: DC | PRN
  Administered 2013-09-18: 10 mL

## 2013-09-18 MED ORDER — ONDANSETRON HCL 4 MG/2ML IJ SOLN
INTRAMUSCULAR | Status: DC | PRN
  Administered 2013-09-18: 4 mg via INTRAVENOUS

## 2013-09-18 MED ORDER — MIDAZOLAM HCL 5 MG/5ML IJ SOLN
INTRAMUSCULAR | Status: DC | PRN
  Administered 2013-09-18: 1 mg via INTRAVENOUS

## 2013-09-18 MED ORDER — PHENYLEPHRINE HCL 10 MG/ML IJ SOLN
INTRAMUSCULAR | Status: DC | PRN
  Administered 2013-09-18: 2000 ug

## 2013-09-18 MED ORDER — LACTATED RINGERS IV SOLN
INTRAVENOUS | Status: DC
  Administered 2013-09-18 (×2): via INTRAVENOUS
  Filled 2013-09-18: qty 1000

## 2013-09-18 MED ORDER — BACITRACIN-NEOMYCIN-POLYMYXIN 400-5-5000 EX OINT
TOPICAL_OINTMENT | CUTANEOUS | Status: DC | PRN
  Administered 2013-09-18: 1 via TOPICAL

## 2013-09-18 MED ORDER — EPHEDRINE SULFATE 50 MG/ML IJ SOLN
INTRAMUSCULAR | Status: DC | PRN
  Administered 2013-09-18 (×5): 10 mg via INTRAVENOUS

## 2013-09-18 MED ORDER — STERILE WATER FOR IRRIGATION IR SOLN
Status: DC | PRN
  Administered 2013-09-18: 500 mL

## 2013-09-18 SURGICAL SUPPLY — 67 items
BAND RUBBER #16 2-1/2X1/16 STR (MISCELLANEOUS) IMPLANT
BANDAGE CO FLEX L/F 1IN X 5YD (GAUZE/BANDAGES/DRESSINGS) IMPLANT
BANDAGE CO FLEX L/F 2IN X 5YD (GAUZE/BANDAGES/DRESSINGS) ×1 IMPLANT
BLADE SURG 15 STRL LF DISP TIS (BLADE) ×1 IMPLANT
BLADE SURG 15 STRL SS (BLADE) ×2
BLADE SURG ROTATE 9660 (MISCELLANEOUS) ×1 IMPLANT
BNDG COHESIVE 3X5 TAN STRL LF (GAUZE/BANDAGES/DRESSINGS) ×2 IMPLANT
BNDG GAUZE ELAST 4 BULKY (GAUZE/BANDAGES/DRESSINGS) IMPLANT
CANISTER SUCTION 1200CC (MISCELLANEOUS) IMPLANT
CANISTER SUCTION 2500CC (MISCELLANEOUS) IMPLANT
CATH FOLEY 2WAY SLVR  5CC 16FR (CATHETERS) ×1
CATH FOLEY 2WAY SLVR 5CC 16FR (CATHETERS) ×1 IMPLANT
CATH ROBINSON RED A/P 8FR (CATHETERS) ×2 IMPLANT
CLOTH BEACON ORANGE TIMEOUT ST (SAFETY) ×2 IMPLANT
COVER MAYO STAND STRL (DRAPES) ×1 IMPLANT
COVER TABLE BACK 60X90 (DRAPES) ×1 IMPLANT
DRAIN PENROSE 18X1/4 LTX STRL (WOUND CARE) IMPLANT
DRAPE PED LAPAROTOMY (DRAPES) ×2 IMPLANT
ELECT NDL TIP 2.8 STRL (NEEDLE) IMPLANT
ELECT NEEDLE TIP 2.8 STRL (NEEDLE) IMPLANT
ELECT REM PT RETURN 9FT ADLT (ELECTROSURGICAL) ×2
ELECTRODE REM PT RTRN 9FT ADLT (ELECTROSURGICAL) ×1 IMPLANT
GAUZE SPONGE 4X4 12PLY STRL LF (GAUZE/BANDAGES/DRESSINGS) IMPLANT
GAUZE SPONGE 4X4 16PLY XRAY LF (GAUZE/BANDAGES/DRESSINGS) ×1 IMPLANT
GLOVE BIO SURGEON STRL SZ8 (GLOVE) ×2 IMPLANT
GOWN PREVENTION PLUS LG XLONG (DISPOSABLE) ×2 IMPLANT
GOWN STRL REIN XL XLG (GOWN DISPOSABLE) ×1 IMPLANT
GOWN STRL REUS W/ TWL LRG LVL3 (GOWN DISPOSABLE) IMPLANT
GOWN STRL REUS W/ TWL XL LVL3 (GOWN DISPOSABLE) IMPLANT
GOWN STRL REUS W/TWL LRG LVL3 (GOWN DISPOSABLE) ×2
GOWN STRL REUS W/TWL XL LVL3 (GOWN DISPOSABLE) ×2
IV NS IRRIG 3000ML ARTHROMATIC (IV SOLUTION) IMPLANT
LOOP VESSEL MAXI BLUE (MISCELLANEOUS) IMPLANT
NDL HYPO 30X.5 LL (NEEDLE) ×3 IMPLANT
NEEDLE HYPO 30X.5 LL (NEEDLE) ×6 IMPLANT
NS IRRIG 500ML POUR BTL (IV SOLUTION) ×2 IMPLANT
PACK BASIN DAY SURGERY FS (CUSTOM PROCEDURE TRAY) ×2 IMPLANT
PENCIL BUTTON HOLSTER BLD 10FT (ELECTRODE) ×2 IMPLANT
PLUG CATH AND CAP STER (CATHETERS) ×2 IMPLANT
RETRACTOR STAY HOOK 5MM (MISCELLANEOUS) ×1 IMPLANT
RETRACTOR STERILE 25.8CMX11.3 (INSTRUMENTS) ×1 IMPLANT
SET IRRIG Y TYPE TUR BLADDER L (SET/KITS/TRAYS/PACK) ×1 IMPLANT
SPONGE LAP 4X18 X RAY DECT (DISPOSABLE) IMPLANT
SUCTION FRAZIER TIP 10 FR DISP (SUCTIONS) IMPLANT
SUPPORT SCROTAL LG STRP (MISCELLANEOUS) IMPLANT
SUT CHROMIC 3 0 SH 27 (SUTURE) ×6 IMPLANT
SUT CHROMIC 4 0 RB 1X27 (SUTURE) ×4 IMPLANT
SUT CHROMIC 5 0 RB 1 27 (SUTURE) IMPLANT
SUT ETHIBOND 2 0 SH (SUTURE) ×6
SUT ETHIBOND 2 0 SH 36X2 (SUTURE) ×3 IMPLANT
SUT ETHIBOND 2 OS 4 DA (SUTURE) IMPLANT
SUT ETHIBOND 3-0 V-5 (SUTURE) IMPLANT
SUT PDS AB 4-0 RB1 27 (SUTURE) IMPLANT
SUT SILK 4 0 (SUTURE) ×2
SUT SILK 4-0 18XBRD TIE 12 (SUTURE) ×1 IMPLANT
SUT VIC AB 5-0 RB1 27 (SUTURE) IMPLANT
SYR 3ML 23GX1 SAFETY (SYRINGE) ×5 IMPLANT
SYR BULB IRRIGATION 50ML (SYRINGE) IMPLANT
SYR CONTROL 10ML LL (SYRINGE) ×2 IMPLANT
SYR TB 1ML 27GX1/2 SAFE (SYRINGE) ×1 IMPLANT
SYR TB 1ML 27GX1/2 SAFETY (SYRINGE) ×2
SYRINGE 10CC LL (SYRINGE) ×4 IMPLANT
TOWEL OR 17X24 6PK STRL BLUE (TOWEL DISPOSABLE) ×4 IMPLANT
TUBE CONNECTING 12X1/4 (SUCTIONS) IMPLANT
WATER STERILE IRR 3000ML UROMA (IV SOLUTION) ×1 IMPLANT
WATER STERILE IRR 500ML POUR (IV SOLUTION) ×2 IMPLANT
YANKAUER SUCT BULB TIP NO VENT (SUCTIONS) ×1 IMPLANT

## 2013-09-18 NOTE — Discharge Instructions (Signed)
Penile postoperative instructions  Wound:  In most cases your incision will have absorbable sutures that will dissolve within the first 10-20 days. Some will fall out even earlier. Expect some redness as the sutures dissolved but this should occur only around the sutures. If there is generalized redness, especially with increasing pain or swelling, let us know. The scrotum and penis will very likely get "black and blue" as the blood in the tissues spread. Sometimes the whole scrotum will turn colors. The black and blue is followed by a yellow and brown color. In time, all the discoloration will go away. In some cases some firm swelling in the area of the testicle and pump may persist for up to 4-6 weeks after the surgery and is considered normal in most cases.  Diet:  You may return to your normal diet within 24 hours following your surgery. You may note some mild nausea and possibly vomiting the first 6-8 hours following surgery. This is usually due to the side effects of anesthesia, and will disappear quite soon. I would suggest clear liquids and a very light meal the first evening following your surgery.  Activity:  Your physical activity should be restricted the first 48 hours. During that time you should remain relatively inactive, moving about only when necessary. During the first 7-10 days following surgery he should avoid lifting any heavy objects (anything greater than 15 pounds), and avoid strenuous exercise. If you work, ask us specifically about your restrictions, both for work and home. We will write a note to your employer if needed.  You should plan to wear a tight pair of jockey shorts or an athletic supporter for the first 4-5 days, even to sleep. This will keep the scrotum immobilized to some degree and keep the swelling down.The position of your penis will determine what is most comfortable but I strongly urge you to keep the penis in the "up" position (toward your head).  Ice  packs should be placed on and off over the scrotum for the first 48 hours. Frozen peas or corn in a ZipLock bag can be frozen, used and re-frozen. Fifteen minutes on and 15 minutes off is a reasonable schedule. The ice is a good pain reliever and keeps the swelling down.  Hygiene:  You may shower 48 hours after your surgery. Tub bathing should be restricted until the seventh day.  Medication:  You will be sent home with some type of pain medication. In many cases you will be sent home with a narcotic pain pill (hydrocodone or oxycodone). If the pain is not too bad, you may take either Tylenol (acetaminophen) or Advil (ibuprofen) which contain no narcotic agents, and might be tolerated a little better, with fewer side effects. If the pain medication you are sent home with does not control the pain, you will have to let us know. Some narcotic pain medications cannot be given or refilled by a phone call to a pharmacy.  Problems you should report to us:   Fever of 101.0 degrees Fahrenheit or greater.  Moderate or severe swelling under the skin incision or involving the scrotum. Drug reaction such as hives, a rash, nausea or vomiting.   Post Anesthesia Home Care Instructions  Activity: Get plenty of rest for the remainder of the day. A responsible adult should stay with you for 24 hours following the procedure.  For the next 24 hours, DO NOT: -Drive a car -Advertising copywriterperate machinery -Drink alcoholic beverages -Take any medication unless instructed by  your physician -Make any legal decisions or sign important papers.  Meals: Start with liquid foods such as gelatin or soup. Progress to regular foods as tolerated. Avoid greasy, spicy, heavy foods. If nausea and/or vomiting occur, drink only clear liquids until the nausea and/or vomiting subsides. Call your physician if vomiting continues.  Special Instructions/Symptoms: Your throat may feel dry or sore from the anesthesia or the breathing tube  placed in your throat during surgery. If this causes discomfort, gargle with warm salt water. The discomfort should disappear within 24 hours.

## 2013-09-18 NOTE — Transfer of Care (Signed)
Immediate Anesthesia Transfer of Care Note  Patient: Peter Clark  Procedure(s) Performed: Procedure(s) (LRB): 16. PLICATION PROCEDURE (N/A)  Patient Location: PACU  Anesthesia Type: General  Level of Consciousness: awake, alert  and oriented  Airway & Oxygen Therapy: Patient Spontanous Breathing and Patient connected to face mask oxygen, oral airway removed in PACU.  Post-op Assessment: Report given to PACU RN and Post -op Vital signs reviewed and stable  Post vital signs: Reviewed and stable  Complications: No apparent anesthesia complications

## 2013-09-18 NOTE — Op Note (Signed)
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-year-old male male with severe dorsal penile curvature secondary to Peyronie's disease. The degree of his dorsal curvature is so extensive it renders him unable to engage in intercourse. This remained stable over several months period of time and we have discussed the treatment options. He is elected to proceed with plication.  ANESTHESIA: General   EBL: Minimal   DRAINS: None   SPECIMEN: None   Description of procedure: After informed consent the patient was brought to the major or, placed on the table and administered general anesthesia. An official timeout was then performed. 60 cc of papaverine were injected into the corpora in order to elicit a artificial erection.   I placed a 15 French Foley catheter in the bladder and drained the bladder. With a full erection I noted significant curvature of approximately 90 primarily to the left and slightly cephalad. I made a Peter Clark on the skin at the area of greatest curvature.   I then made a midline incision over the corpus spongiosum in the mid penis. This was where the greatest amount of curvature was noted. He had a good effect from the papaverine. I then cleared the tissue from both the right and left corpus cavernosum for about 4 mm on each side of the corpus spongiosum. I then marked 8 equidistant dots on the corpus cavernosum for proximal and 4 distal to the area of greatest curvature. 2-0 braided polyester suture was then used for the plicating sutures by starting proximally and placing this in the most proximal dot and then beneath the corpus cavernosum for length of approximately a centimeter and then back out advancing another centimeter and then in and out again. This was performed on each side proximally and distally so  there were 4 plicating sutures. I tied each of these with a surgeon's knot and placed a shod hemostat against the surgeon's knot and checked for straightening of the penis. There appeared to be good straightening. I adjusted each suture just slightly so that the penis was straight both dorsally and ventrally as well as laterally. I then tied each of the 4 sutures.  I removed the catheter and again checked and noted good straightening of the penis. I checked for bleeding and cauterized any bleeding points and then reapproximated the deep fascia with running, 3-0 chromic. I then closed the skin with running 3-0 chromic. 1/2% plain Marcaine was then used to perform a dorsal penile block. Neosporin was applied to the incision and the penis was gently wrapped with folded 4 x 4's and a Coban was applied. The patient was awakened and taken to the recovery room in stable and satisfactory condition. He tolerated the procedure well and there were no intraoperative complications. Needle sponge and instrument counts were correct x2 at the end of the operation.   PLAN OF CARE: Discharge to home after PACU   PATIENT DISPOSITION: PACU - hemodynamically stable.

## 2013-09-18 NOTE — Anesthesia Procedure Notes (Signed)
Procedure Name: LMA Insertion Date/Time: 09/18/2013 7:39 AM Performed by: Norva PavlovALLAWAY, Mahum Betten G Pre-anesthesia Checklist: Patient identified, Emergency Drugs available, Suction available and Patient being monitored Patient Re-evaluated:Patient Re-evaluated prior to inductionOxygen Delivery Method: Circle System Utilized Preoxygenation: Pre-oxygenation with 100% oxygen Intubation Type: IV induction Ventilation: Mask ventilation without difficulty LMA: LMA inserted LMA Size: 4.0 Number of attempts: 1 Airway Equipment and Method: bite block Placement Confirmation: positive ETCO2 Tube secured with: Tape Dental Injury: Teeth and Oropharynx as per pre-operative assessment

## 2013-09-18 NOTE — Anesthesia Postprocedure Evaluation (Signed)
Anesthesia Post Note  Patient: Peter Clark  Procedure(s) Performed: Procedure(s) (LRB): 16. PLICATION PROCEDURE (N/A)  Anesthesia type: General  Patient location: PACU  Post pain: Pain level controlled  Post assessment: Post-op Vital signs reviewed  Last Vitals: BP 126/65  Pulse 93  Temp(Src) 36.5 C (Oral)  Resp 14  Ht 5\' 6"  (1.676 m)  Wt 171 lb (77.565 kg)  BMI 27.61 kg/m2  SpO2 94%  Post vital signs: Reviewed  Level of consciousness: sedated  Complications: No apparent anesthesia complications

## 2013-09-18 NOTE — Addendum Note (Signed)
Addendum created 09/18/13 1105 by Norva Pavlovobin G Zae Kirtz, CRNA   Modules edited: Anesthesia Blocks and Procedures, Anesthesia Flowsheet, Clinical Notes   Clinical Notes:  File: [id number]

## 2013-09-19 ENCOUNTER — Encounter (HOSPITAL_BASED_OUTPATIENT_CLINIC_OR_DEPARTMENT_OTHER): Payer: Self-pay | Admitting: Urology

## 2013-10-03 ENCOUNTER — Encounter: Payer: Self-pay | Admitting: Internal Medicine

## 2013-10-03 ENCOUNTER — Other Ambulatory Visit (INDEPENDENT_AMBULATORY_CARE_PROVIDER_SITE_OTHER): Payer: BC Managed Care – PPO

## 2013-10-03 ENCOUNTER — Ambulatory Visit (INDEPENDENT_AMBULATORY_CARE_PROVIDER_SITE_OTHER): Payer: BC Managed Care – PPO | Admitting: Internal Medicine

## 2013-10-03 VITALS — BP 132/80 | HR 76 | Temp 98.8°F | Ht 66.0 in | Wt 174.5 lb

## 2013-10-03 DIAGNOSIS — I10 Essential (primary) hypertension: Secondary | ICD-10-CM

## 2013-10-03 DIAGNOSIS — Z0000 Encounter for general adult medical examination without abnormal findings: Secondary | ICD-10-CM

## 2013-10-03 LAB — CBC WITH DIFFERENTIAL/PLATELET
BASOS PCT: 0.4 % (ref 0.0–3.0)
Basophils Absolute: 0 10*3/uL (ref 0.0–0.1)
EOS PCT: 2.1 % (ref 0.0–5.0)
Eosinophils Absolute: 0.2 10*3/uL (ref 0.0–0.7)
HCT: 40.5 % (ref 39.0–52.0)
Hemoglobin: 14 g/dL (ref 13.0–17.0)
LYMPHS PCT: 24.1 % (ref 12.0–46.0)
Lymphs Abs: 1.9 10*3/uL (ref 0.7–4.0)
MCHC: 34.4 g/dL (ref 30.0–36.0)
MCV: 89.6 fl (ref 78.0–100.0)
Monocytes Absolute: 0.7 10*3/uL (ref 0.1–1.0)
Monocytes Relative: 9.2 % (ref 3.0–12.0)
Neutro Abs: 5.2 10*3/uL (ref 1.4–7.7)
Neutrophils Relative %: 64.2 % (ref 43.0–77.0)
PLATELETS: 205 10*3/uL (ref 150.0–400.0)
RBC: 4.52 Mil/uL (ref 4.22–5.81)
RDW: 13.6 % (ref 11.5–14.6)
WBC: 8.1 10*3/uL (ref 4.5–10.5)

## 2013-10-03 LAB — URINALYSIS, ROUTINE W REFLEX MICROSCOPIC
BILIRUBIN URINE: NEGATIVE
Hgb urine dipstick: NEGATIVE
Ketones, ur: NEGATIVE
LEUKOCYTES UA: NEGATIVE
Nitrite: NEGATIVE
SPECIFIC GRAVITY, URINE: 1.02 (ref 1.000–1.030)
TOTAL PROTEIN, URINE-UPE24: NEGATIVE
Urine Glucose: NEGATIVE
Urobilinogen, UA: 0.2 (ref 0.0–1.0)
pH: 6 (ref 5.0–8.0)

## 2013-10-03 MED ORDER — SIMVASTATIN 40 MG PO TABS: 40.0000 mg | ORAL_TABLET | Freq: Every day | ORAL | Status: DC

## 2013-10-03 MED ORDER — LISINOPRIL 20 MG PO TABS: 20.0000 mg | ORAL_TABLET | Freq: Every day | ORAL | Status: DC

## 2013-10-03 MED ORDER — TRIAMCINOLONE ACETONIDE 0.1 % EX CREA: 1.0000 "application " | TOPICAL_CREAM | Freq: Two times a day (BID) | CUTANEOUS | Status: DC

## 2013-10-03 MED ORDER — LEVOTHYROXINE SODIUM 50 MCG PO TABS: 25.0000 ug | ORAL_TABLET | Freq: Every day | ORAL | Status: DC

## 2013-10-03 MED ORDER — TAMSULOSIN HCL 0.4 MG PO CAPS: 0.4000 mg | ORAL_CAPSULE | Freq: Every day | ORAL | Status: DC

## 2013-10-03 NOTE — Patient Instructions (Signed)
OK to stop the lisinopril HCT  Please take all new medication as prescribed - the lisinopril 20 mg per day, and the steroid cream for the ear itching  Please continue all other medications as before, and refills have been done if requested.  Please have the pharmacy call with any other refills you may need.  Please continue your efforts at being more active, low cholesterol diet, and weight control.  You are otherwise up to date with prevention measures today.  Please go to the LAB in the Basement (turn left off the elevator) for the tests to be done today  You will be contacted by phone if any changes need to be made immediately.  Otherwise, you will receive a letter about your results with an explanation, but please check with MyChart first.  Please remember to sign up for MyChart if you have not done so, as this will be important to you in the future with finding out test results, communicating by private email, and scheduling acute appointments online when needed.  Please return in 1 year for your yearly visit, or sooner if needed

## 2013-10-03 NOTE — Progress Notes (Signed)
Pre visit review using our clinic review tool, if applicable. No additional management support is needed unless otherwise documented below in the visit note. 

## 2013-10-03 NOTE — Progress Notes (Signed)
Subjective:    Patient ID: Peter Clark, male    DOB: [birth year], 63 y.o.   MRN: [id number]  HPI  Here for wellness and f/u;  Overall doing ok;  Pt denies CP, worsening SOB, DOE, wheezing, orthopnea, PND, worsening LE edema, palpitations, dizziness or syncope.  Pt denies neurological change such as new headache, facial or extremity weakness.  Pt denies polydipsia, polyuria, or low sugar symptoms. Pt states overall good compliance with treatment and medications, good tolerability, and has been trying to follow lower cholesterol diet.  Pt denies worsening depressive symptoms, suicidal ideation or panic. No fever, night sweats, wt loss, loss of appetite, or other constitutional symptoms.  Pt states good ability with ADL's, has low fall risk, home safety reviewed and adequate, no other significant changes in hearing or vision, and only occasionally active with exercise. BP somewhat labile, only taking half lis[-hct for the past yr, still fairly often 150's, and rare 107 sbp, occas low BP despite wt gain just over 12 lbs in the past yr. No acute complaints Past Medical History  Diagnosis Date  . Hyperlipidemia   . Hypertension   . BPH (benign prostatic hypertrophy)   . Hypothyroidism   . Back pain   . Wears glasses   . Peyronie disease    Past Surgical History  Procedure Laterality Date  . Inguinal hernia repair Right AS TEEN  . Nesbit procedure N/A 09/18/2013    Procedure: 16. PLICATION PROCEDURE;  Surgeon: Garnett FarmMark C Ottelin, MD;  [facility] [place];  Service: Urology;  Laterality: N/A;    reports that he quit smoking about 25 years ago. His smoking use included Cigarettes. He smoked 0.00 packs per day. He has never used smokeless tobacco. He reports that he does not drink alcohol or use illicit drugs. family history includes Stroke in his mother. No Known Allergies Current Outpatient Prescriptions on File Prior to Visit  Medication Sig Dispense Refill  . aspirin 81 MG  tablet Take 81 mg by mouth daily.         No current facility-administered medications on file prior to visit.    Review of Systems Constitutional: Negative for increased diaphoresis, other activity, appetite or other siginficant weight change  HENT: Negative for worsening hearing loss, ear pain, facial swelling, mouth sores and neck stiffness.   Eyes: Negative for other worsening pain, redness or visual disturbance.  Respiratory: Negative for shortness of breath and wheezing.   Cardiovascular: Negative for chest pain and palpitations.  Gastrointestinal: Negative for diarrhea, blood in stool, abdominal distention or other pain Genitourinary: Negative for hematuria, flank pain or change in urine volume.  Musculoskeletal: Negative for myalgias or other joint complaints.  Skin: Negative for color change and wound.  Neurological: Negative for syncope and numbness. other than noted Hematological: Negative for adenopathy. or other swelling Psychiatric/Behavioral: Negative for hallucinations, self-injury, decreased concentration or other worsening agitation.      Objective:   Physical Exam BP 132/80  Pulse 76  Temp(Src) 98.8 F (37.1 C) (Oral)  Ht 5\' 6"  (1.676 m)  Wt 174 lb 8 oz (79.153 kg)  BMI 28.18 kg/m2  SpO2 95% VS noted,  Constitutional: Pt is oriented to person, place, and time. Appears well-developed and well-nourished.  Head: Normocephalic and atraumatic.  Right Ear: External ear normal.  Left Ear: External ear normal.  Nose: Nose normal.  Mouth/Throat: Oropharynx is clear and moist.  Eyes: Conjunctivae and EOM are normal. Pupils are equal, round, and reactive to light.  Neck: Normal range of motion. Neck supple. No JVD present. No tracheal deviation present.  Cardiovascular: Normal rate, regular rhythm, normal heart sounds and intact distal pulses.   Pulmonary/Chest: Effort normal and breath sounds without rales or wheezing  Abdominal: Soft. Bowel sounds are normal. NT. No  HSM  Musculoskeletal: Normal range of motion. Exhibits no edema.  Lymphadenopathy:  Has no cervical adenopathy.  Neurological: Pt is alert and oriented to person, place, and time. Pt has normal reflexes. No cranial nerve deficit. Motor grossly intact Skin: Skin is warm and dry. No rash noted.  Psychiatric:  Has normal mood and affect. Behavior is normal.     Assessment & Plan:

## 2013-10-04 ENCOUNTER — Encounter: Payer: Self-pay | Admitting: Internal Medicine

## 2013-10-04 LAB — HEPATIC FUNCTION PANEL
ALT: 22 U/L (ref 0–53)
AST: 25 U/L (ref 0–37)
Albumin: 4.2 g/dL (ref 3.5–5.2)
Alkaline Phosphatase: 72 U/L (ref 39–117)
BILIRUBIN TOTAL: 0.7 mg/dL (ref 0.3–1.2)
Bilirubin, Direct: 0.1 mg/dL (ref 0.0–0.3)
Total Protein: 6.8 g/dL (ref 6.0–8.3)

## 2013-10-04 LAB — LIPID PANEL
CHOLESTEROL: 147 mg/dL (ref 0–200)
HDL: 31.3 mg/dL — ABNORMAL LOW (ref >39.00)
LDL CALC: 54 mg/dL (ref 0–99)
TRIGLYCERIDES: 311 mg/dL — AB (ref 0.0–149.0)
Total CHOL/HDL Ratio: 5
VLDL: 62.2 mg/dL — AB (ref 0.0–40.0)

## 2013-10-04 LAB — BASIC METABOLIC PANEL
BUN: 14 mg/dL (ref 6–23)
CHLORIDE: 103 meq/L (ref 96–112)
CO2: 28 mEq/L (ref 19–32)
CREATININE: 0.8 mg/dL (ref 0.4–1.5)
Calcium: 9.5 mg/dL (ref 8.4–10.5)
GFR: 98.13 mL/min (ref >60.00)
GLUCOSE: 90 mg/dL (ref 70–99)
Potassium: 3.8 mEq/L (ref 3.5–5.1)
Sodium: 138 mEq/L (ref 135–145)

## 2013-10-04 LAB — PSA: PSA: 0.89 ng/mL (ref 0.10–4.00)

## 2013-10-04 LAB — TSH: TSH: 4.71 u[IU]/mL (ref 0.35–5.50)

## 2013-10-06 NOTE — Assessment & Plan Note (Signed)

## 2013-10-06 NOTE — Assessment & Plan Note (Signed)
For change lis hct to lisinopril 20 qd for hopeful better control, less side effect,  to f/u any worsening symptoms or concerns BP Readings from Last 3 Encounters:  10/03/13 132/80  09/18/13 127/76  09/18/13 127/76

## 2013-10-12 ENCOUNTER — Telehealth: Payer: Self-pay | Admitting: Internal Medicine

## 2013-10-12 NOTE — Telephone Encounter (Signed)
Informed the patient of results of glucose on 10/03/13.

## 2013-10-12 NOTE — Telephone Encounter (Signed)
Patient is calling to find out if his sugar was checked last time he had labs drawn. If so what his results were. Please advise.

## 2014-02-09 ENCOUNTER — Telehealth: Payer: Self-pay | Admitting: Internal Medicine

## 2014-02-09 NOTE — Telephone Encounter (Signed)
I would have to have a reason for the referral

## 2014-02-09 NOTE — Telephone Encounter (Signed)
Pt wife called in stating he needed a referral to ears nose and throat  Dr.  Catalina Pizza pt that she may need appt to get referral.  She wanted to see if he could get one with out coming in.  She said that he has been seen for this issue before and wants to call [phone] for the referral.  She didn't have a speific dr in mind.    Thank You.

## 2014-02-09 NOTE — Telephone Encounter (Signed)
Called left detailed message of MD instructions and to please call back with more specific reason in order for PCP to do referral.

## 2014-02-10 NOTE — Telephone Encounter (Signed)
Per Earl Gala (registrar), patient called back stating he feels pressure, as if he has just come from under water. Not very painful. Wants a ENT referral  To a specialist at the number [phone] per insurance

## 2014-02-12 NOTE — Telephone Encounter (Signed)
If no fever, should take Mucinex 600 - 2 tab bid until symptoms improved, as this is most likely related to sinus congestion with ear involvement, maybe allergies or recent cold?

## 2014-02-13 NOTE — Telephone Encounter (Signed)
Called the patient informed of MD instructions.  The patient stated he would need to call back and schedule appointment with PCP to discuss problem.

## 2014-02-14 ENCOUNTER — Ambulatory Visit (INDEPENDENT_AMBULATORY_CARE_PROVIDER_SITE_OTHER): Payer: BC Managed Care – PPO | Admitting: Internal Medicine

## 2014-02-14 ENCOUNTER — Encounter: Payer: Self-pay | Admitting: Internal Medicine

## 2014-02-14 VITALS — BP 120/86 | HR 74 | Temp 98.1°F | Wt 178.0 lb

## 2014-02-14 DIAGNOSIS — H6993 Unspecified Eustachian tube disorder, bilateral: Secondary | ICD-10-CM

## 2014-02-14 DIAGNOSIS — J309 Allergic rhinitis, unspecified: Secondary | ICD-10-CM

## 2014-02-14 DIAGNOSIS — H6990 Unspecified Eustachian tube disorder, unspecified ear: Secondary | ICD-10-CM

## 2014-02-14 DIAGNOSIS — H6983 Other specified disorders of Eustachian tube, bilateral: Secondary | ICD-10-CM

## 2014-02-14 DIAGNOSIS — H6980 Other specified disorders of Eustachian tube, unspecified ear: Secondary | ICD-10-CM | POA: Insufficient documentation

## 2014-02-14 DIAGNOSIS — I10 Essential (primary) hypertension: Secondary | ICD-10-CM

## 2014-02-14 HISTORY — DX: Allergic rhinitis, unspecified: J30.9

## 2014-02-14 MED ORDER — METHYLPREDNISOLONE ACETATE 80 MG/ML IJ SUSP
80.0000 mg | Freq: Once | INTRAMUSCULAR | Status: AC
  Administered 2014-02-14: 80 mg via INTRAMUSCULAR

## 2014-02-14 MED ORDER — FLUTICASONE PROPIONATE 50 MCG/ACT NA SUSP: 2.0000 | Freq: Every day | NASAL | Status: DC

## 2014-02-14 MED ORDER — CETIRIZINE HCL 10 MG PO TABS: 10.0000 mg | ORAL_TABLET | Freq: Every day | ORAL | Status: DC

## 2014-02-14 NOTE — Patient Instructions (Signed)
Please take all new medication as prescribed - the zyrtec, and flonase for sinus congestion/ear congestion  You can also take Mucinex (or it's generic off brand) for congestion at 600 - 1200 mg twice per day, and tylenol as needed for discomfort.  Please continue all other medications as before, and refills have been done if requested.  Please have the pharmacy call with any other refills you may need.  Please keep your appointments with your specialists as you may have planned  Please call in 1-2 wks for ENT referral if not improved

## 2014-02-14 NOTE — Progress Notes (Signed)
Pre visit review using our clinic review tool, if applicable. No additional management support is needed unless otherwise documented below in the visit note. 

## 2014-02-14 NOTE — Progress Notes (Signed)
Subjective:    Patient ID: Peter Clark, male    DOB: [birth year], 63 y.o.   MRN: [id number]  HPI  Here to f/u; overall doing ok,  Pt denies chest pain, increased sob or doe, wheezing, orthopnea, PND, increased LE swelling, palpitations, dizziness or syncope.  Pt denies polydipsia, polyuria, or low sugar symptoms  Pt denies new neurological symptoms such as new headache, or facial or extremity weakness or numbness.   Pt states overall good compliance with meds.  Does have several wks ongoing nasal allergy symptoms with clearish congestion, itch and sneezing, without fever, pain, ST, cough, swelling or wheezing. Also with bilat ear fullness left > right with popping. No vertigo, hearing loss or falls. Past Medical History  Diagnosis Date  . Hyperlipidemia   . Hypertension   . BPH (benign prostatic hypertrophy)   . Hypothyroidism   . Back pain   . Wears glasses   . Peyronie disease   . Allergic rhinitis, cause unspecified 02/14/2014   Past Surgical History  Procedure Laterality Date  . Inguinal hernia repair Right AS TEEN  . Nesbit procedure N/A 09/18/2013    Procedure: 16. PLICATION PROCEDURE;  Surgeon: Garnett Farm, MD;  Location: [facility];  Service: Urology;  Laterality: N/A;    reports that he quit smoking about 25 years ago. His smoking use included Cigarettes. He smoked 0.00 packs per day. He has never used smokeless tobacco. He reports that he does not drink alcohol or use illicit drugs. family history includes Stroke in his mother. No Known Allergies Current Outpatient Prescriptions on File Prior to Visit  Medication Sig Dispense Refill  . aspirin 81 MG tablet Take 81 mg by mouth daily.        Marland Kitchen levothyroxine (SYNTHROID, LEVOTHROID) 50 MCG tablet Take 0.5 tablets (25 mcg total) by mouth daily before breakfast. 1/2 tab by mouth daily  90 tablet  3  . lisinopril (PRINIVIL,ZESTRIL) 20 MG tablet Take 1 tablet (20 mg total) by mouth daily.  90 tablet  3  .  simvastatin (ZOCOR) 40 MG tablet Take 1 tablet (40 mg total) by mouth at bedtime.  90 tablet  3  . tamsulosin (FLOMAX) 0.4 MG CAPS capsule Take 1 capsule (0.4 mg total) by mouth daily.  90 capsule  3  . triamcinolone cream (KENALOG) 0.1 % Apply 1 application topically 2 (two) times daily.  30 g  0   No current facility-administered medications on file prior to visit.   Review of Systems  Constitutional: Negative for unusual diaphoresis or other sweats  HENT: Negative for ringing in ear Eyes: Negative for double vision or worsening visual disturbance.  Respiratory: Negative for choking and stridor.   Gastrointestinal: Negative for vomiting or other signifcant bowel change Genitourinary: Negative for hematuria or decreased urine volume.  Musculoskeletal: Negative for other MSK pain or swelling Skin: Negative for color change and worsening wound.  Neurological: Negative for tremors and numbness other than noted  Psychiatric/Behavioral: Negative for decreased concentration or agitation other than above       Objective:   Physical Exam BP 120/86  Pulse 74  Temp(Src) 98.1 F (36.7 C) (Oral)  Wt 178 lb (80.74 kg)  SpO2 96% VS noted,  Constitutional: Pt appears well-developed, well-nourished.  HENT: Head: NCAT.  Right Ear: External ear normal.  Left Ear: External ear normal.  Eyes: . Pupils are equal, round, and reactive to light. Conjunctivae and EOM are normal Bilat tm's with mild erythema.  Max sinus  areas non tender.  Pharynx with mild erythema, no exudate Neck: Normal range of motion. Neck supple.  Cardiovascular: Normal rate and regular rhythm.   Pulmonary/Chest: Effort normal and breath sounds normal.  - no rales or wheezing Neurological: Pt is alert. Not confused , motor grossly intact Skin: Skin is warm. No rash Psychiatric: Pt behavior is normal. No agitation. mild nervous    Assessment & Plan:

## 2014-02-18 NOTE — Assessment & Plan Note (Signed)
Mild to mod, for zyrtec/flonase asd,  to f/u any worsening symptoms or concerns 

## 2014-02-18 NOTE — Assessment & Plan Note (Signed)
Also for mucinex bid prn,  to f/u any worsening symptoms or concerns 

## 2014-02-18 NOTE — Assessment & Plan Note (Signed)
stable overall by history and exam, recent data reviewed with pt, and pt to continue medical treatment as before,  to f/u any worsening symptoms or concerns BP Readings from Last 3 Encounters:  02/14/14 120/86  10/03/13 132/80  09/18/13 127/76

## 2014-08-08 ENCOUNTER — Other Ambulatory Visit: Payer: Self-pay | Admitting: *Deleted

## 2014-08-08 MED ORDER — LEVOTHYROXINE SODIUM 50 MCG PO TABS: 25.0000 ug | ORAL_TABLET | Freq: Every day | ORAL | Status: DC

## 2014-08-08 MED ORDER — SIMVASTATIN 40 MG PO TABS: 40.0000 mg | ORAL_TABLET | Freq: Every day | ORAL | Status: DC

## 2014-08-08 MED ORDER — TAMSULOSIN HCL 0.4 MG PO CAPS: 0.4000 mg | ORAL_CAPSULE | Freq: Every day | ORAL | Status: DC

## 2014-08-08 MED ORDER — LISINOPRIL 20 MG PO TABS: 20.0000 mg | ORAL_TABLET | Freq: Every day | ORAL | Status: DC

## 2014-08-08 NOTE — Telephone Encounter (Signed)
Pt states he is needing to have his medication sent to [facility]. No longer using [facility]. Infirm pt will send...Raechel Chute/lmb

## 2014-08-13 ENCOUNTER — Other Ambulatory Visit (INDEPENDENT_AMBULATORY_CARE_PROVIDER_SITE_OTHER): Payer: BLUE CROSS/BLUE SHIELD

## 2014-08-13 ENCOUNTER — Encounter: Payer: Self-pay | Admitting: Internal Medicine

## 2014-08-13 ENCOUNTER — Ambulatory Visit (INDEPENDENT_AMBULATORY_CARE_PROVIDER_SITE_OTHER): Payer: BLUE CROSS/BLUE SHIELD | Admitting: Internal Medicine

## 2014-08-13 VITALS — BP 124/86 | HR 78 | Temp 98.1°F | Ht 66.0 in | Wt 168.0 lb

## 2014-08-13 DIAGNOSIS — R5383 Other fatigue: Secondary | ICD-10-CM

## 2014-08-13 DIAGNOSIS — R5381 Other malaise: Secondary | ICD-10-CM

## 2014-08-13 DIAGNOSIS — H9392 Unspecified disorder of left ear: Secondary | ICD-10-CM

## 2014-08-13 DIAGNOSIS — R42 Dizziness and giddiness: Secondary | ICD-10-CM

## 2014-08-13 LAB — CBC WITH DIFFERENTIAL/PLATELET
BASOS ABS: 0 10*3/uL (ref 0.0–0.1)
BASOS PCT: 0.4 % (ref 0.0–3.0)
Eosinophils Absolute: 0.1 10*3/uL (ref 0.0–0.7)
Eosinophils Relative: 1.3 % (ref 0.0–5.0)
HCT: 41.7 % (ref 39.0–52.0)
HEMOGLOBIN: 14.4 g/dL (ref 13.0–17.0)
LYMPHS PCT: 23.2 % (ref 12.0–46.0)
Lymphs Abs: 1.5 10*3/uL (ref 0.7–4.0)
MCHC: 34.6 g/dL (ref 30.0–36.0)
MCV: 87.1 fl (ref 78.0–100.0)
Monocytes Absolute: 0.8 10*3/uL (ref 0.1–1.0)
Monocytes Relative: 12 % (ref 3.0–12.0)
Neutro Abs: 3.9 10*3/uL (ref 1.4–7.7)
Neutrophils Relative %: 63.1 % (ref 43.0–77.0)
Platelets: 155 10*3/uL (ref 150.0–400.0)
RBC: 4.79 Mil/uL (ref 4.22–5.81)
RDW: 12.4 % (ref 11.5–15.5)
WBC: 6.2 10*3/uL (ref 4.0–10.5)

## 2014-08-13 LAB — BASIC METABOLIC PANEL
BUN: 24 mg/dL — ABNORMAL HIGH (ref 6–23)
CO2: 28 mEq/L (ref 19–32)
CREATININE: 0.87 mg/dL (ref 0.40–1.50)
Calcium: 9.7 mg/dL (ref 8.4–10.5)
Chloride: 106 mEq/L (ref 96–112)
GFR: 93.98 mL/min (ref >60.00)
GLUCOSE: 106 mg/dL — AB (ref 70–99)
Potassium: 4.3 mEq/L (ref 3.5–5.1)
SODIUM: 139 meq/L (ref 135–145)

## 2014-08-13 LAB — HEPATIC FUNCTION PANEL
ALT: 30 U/L (ref 0–53)
AST: 21 U/L (ref 0–37)
Albumin: 4.4 g/dL (ref 3.5–5.2)
Alkaline Phosphatase: 66 U/L (ref 39–117)
Bilirubin, Direct: 0.2 mg/dL (ref 0.0–0.3)
Total Bilirubin: 0.7 mg/dL (ref 0.2–1.2)
Total Protein: 7.3 g/dL (ref 6.0–8.3)

## 2014-08-13 LAB — TSH: TSH: 0.04 u[IU]/mL — ABNORMAL LOW (ref 0.35–4.50)

## 2014-08-13 NOTE — Progress Notes (Signed)
Pre visit review using our clinic review tool, if applicable. No additional management support is needed unless otherwise documented below in the visit note. 

## 2014-08-13 NOTE — Patient Instructions (Addendum)
  Your next office appointment will be determined based upon review of your pending labs  Those instructions will be transmitted to you by mail. Critical values will be called. Followup as needed for any active or acute issue. Please report any significant change in your symptoms.  Perform isometric exercise of calves  ( while seated go up on toes to count of 5 & then onto heels for 5 count). Repeat  4- 5 times prior to standing if you've been seated or supine for any significant period of time as BP drops with such positions.   ENT referral if labs non diagnostic

## 2014-08-13 NOTE — Progress Notes (Signed)
   Subjective:    Patient ID: Peter Clark, male    DOB: Dec 29, 1950, 64 y.o.   MRN: [id number]  HPI  His symptoms began 3 weeks ago as dizziness upon standing without specific preexisting trigger such as upper respiratory tract infection or barotrauma. The dizziness will last less than 1 minute. It is not associated with frank vertigo. Unrelated he's had some arm weakness at times as well as chronic hearing loss. He also describes malaise for one month.  There's been no cardiac or neurologic prodrome prior to the symptoms. He has no benign positional vertigo symptoms.  PMH of Eustachian tube dysfunction  Review of Systems  Denied were any change in heart rhythm or rate prior to the event. There was no associated chest pain or shortness of breath .  Also specifically denied prior to the episode were headache, limb weakness, tingling, or numbness. No seizure activity noted.  He specifically denies fever, chills, sweats, or change in weight.   He has no blurred vision, double vision, loss of vision.  Frontal headache, facial pain , nasal purulence, dental pain, sore throat , otic pain or otic discharge denied. No fever , chills or sweats.    Objective:   Physical Exam  Pertinent or positive findings include:  There is thinning of the eyebrows laterally.  Pattern alopecia is present.  The left tympanic membrane is dull.  He has an upper dental plate and lower partial.  There is decreased hearing especially on the left. The tuning fork lateralizes to the left. Air conduction is greater than bone conduction bilaterally.  He has no cranial nerve deficit on direct examination.  Deep tendon reflexes, strength, and tone are equal and normal.  Romberg and finger-nose testing is negative.  Gait to include heel and toe testing was slightly unsteady. There was no change in his supine, sitting, standing blood pressures but he was slightly unsteady upon standing.   General appearance  :adequately nourished; in no distress. Eyes: No conjunctival inflammation or scleral icterus is present. Oral exam:  Lips and gums are healthy appearing.There is no oropharyngeal erythema or exudate noted.  Heart:  Normal rate and regular rhythm. S1 and S2 normal without gallop, murmur, click, rub or other extra sounds   Lungs:Chest clear to auscultation; no wheezes, rhonchi,rales ,or rubs present.No increased work of breathing.  Abdomen: bowel sounds normal, soft and non-tender without masses, organomegaly or hernias noted.  No guarding or rebound.  Vascular : all pulses equal ; no bruits present. Skin:Warm & dry.  Intact without suspicious lesions or rashes ; no tenting  Lymphatic: No lymphadenopathy is noted about the head, neck, axilla.   Neuro: Strength, tone & DTRs normal.        Assessment & Plan:  #1 postural dizziness  #2 malaise Plan: See orders recommendations.  If labs are nondiagnostic ENT referral recommended.  Isometrics prior to standing also encouraged.

## 2014-08-14 ENCOUNTER — Other Ambulatory Visit: Payer: Self-pay | Admitting: Internal Medicine

## 2014-08-14 DIAGNOSIS — R7989 Other specified abnormal findings of blood chemistry: Secondary | ICD-10-CM

## 2014-08-15 ENCOUNTER — Telehealth: Payer: Self-pay

## 2014-08-15 ENCOUNTER — Other Ambulatory Visit (INDEPENDENT_AMBULATORY_CARE_PROVIDER_SITE_OTHER): Payer: BLUE CROSS/BLUE SHIELD

## 2014-08-15 DIAGNOSIS — R739 Hyperglycemia, unspecified: Secondary | ICD-10-CM

## 2014-08-15 LAB — HEMOGLOBIN A1C: HEMOGLOBIN A1C: 5.6 % (ref 4.6–6.5)

## 2014-08-15 NOTE — Telephone Encounter (Signed)
Request for add on has been faxed to lab 

## 2014-08-15 NOTE — Telephone Encounter (Signed)
-----   Message from Pecola LawlessWilliam F Hopper, MD sent at 08/14/2014  6:44 PM EST ----- See low TSH Please add A1c (R73.9)

## 2014-09-07 ENCOUNTER — Other Ambulatory Visit: Payer: Self-pay

## 2014-09-07 ENCOUNTER — Other Ambulatory Visit (INDEPENDENT_AMBULATORY_CARE_PROVIDER_SITE_OTHER): Payer: BLUE CROSS/BLUE SHIELD

## 2014-09-07 DIAGNOSIS — Z0000 Encounter for general adult medical examination without abnormal findings: Secondary | ICD-10-CM

## 2014-09-07 LAB — CBC WITH DIFFERENTIAL/PLATELET
BASOS PCT: 0.4 % (ref 0.0–3.0)
Basophils Absolute: 0 10*3/uL (ref 0.0–0.1)
Eosinophils Absolute: 0.1 10*3/uL (ref 0.0–0.7)
Eosinophils Relative: 2.3 % (ref 0.0–5.0)
HCT: 41.6 % (ref 39.0–52.0)
Hemoglobin: 14.2 g/dL (ref 13.0–17.0)
LYMPHS PCT: 25.2 % (ref 12.0–46.0)
Lymphs Abs: 1.3 10*3/uL (ref 0.7–4.0)
MCHC: 34.1 g/dL (ref 30.0–36.0)
MCV: 87.1 fl (ref 78.0–100.0)
MONOS PCT: 10.6 % (ref 3.0–12.0)
Monocytes Absolute: 0.6 10*3/uL (ref 0.1–1.0)
NEUTROS ABS: 3.3 10*3/uL (ref 1.4–7.7)
NEUTROS PCT: 61.5 % (ref 43.0–77.0)
Platelets: 161 10*3/uL (ref 150.0–400.0)
RBC: 4.77 Mil/uL (ref 4.22–5.81)
RDW: 12.7 % (ref 11.5–15.5)
WBC: 5.3 10*3/uL (ref 4.0–10.5)

## 2014-09-07 LAB — HEPATIC FUNCTION PANEL
ALT: 31 U/L (ref 0–53)
AST: 27 U/L (ref 0–37)
Albumin: 4.1 g/dL (ref 3.5–5.2)
Alkaline Phosphatase: 64 U/L (ref 39–117)
Bilirubin, Direct: 0.2 mg/dL (ref 0.0–0.3)
Total Bilirubin: 0.9 mg/dL (ref 0.2–1.2)
Total Protein: 6.8 g/dL (ref 6.0–8.3)

## 2014-09-07 LAB — BASIC METABOLIC PANEL
BUN: 12 mg/dL (ref 6–23)
CO2: 30 meq/L (ref 19–32)
CREATININE: 0.96 mg/dL (ref 0.40–1.50)
Calcium: 9.6 mg/dL (ref 8.4–10.5)
Chloride: 106 mEq/L (ref 96–112)
GFR: 83.87 mL/min (ref >60.00)
Glucose, Bld: 96 mg/dL (ref 70–99)
Potassium: 4.4 mEq/L (ref 3.5–5.1)
SODIUM: 139 meq/L (ref 135–145)

## 2014-09-07 LAB — URINALYSIS, ROUTINE W REFLEX MICROSCOPIC
HGB URINE DIPSTICK: NEGATIVE
Ketones, ur: NEGATIVE
LEUKOCYTES UA: NEGATIVE
Nitrite: NEGATIVE
Specific Gravity, Urine: 1.025 (ref 1.000–1.030)
Total Protein, Urine: NEGATIVE
UROBILINOGEN UA: 0.2 (ref 0.0–1.0)
Urine Glucose: NEGATIVE
pH: 6 (ref 5.0–8.0)

## 2014-09-07 LAB — LIPID PANEL
CHOLESTEROL: 123 mg/dL (ref 0–200)
HDL: 39.2 mg/dL (ref >39.00)
LDL Cholesterol: 64 mg/dL (ref 0–99)
NonHDL: 83.8
TRIGLYCERIDES: 99 mg/dL (ref 0.0–149.0)
Total CHOL/HDL Ratio: 3
VLDL: 19.8 mg/dL (ref 0.0–40.0)

## 2014-09-07 LAB — TSH: TSH: 0.18 u[IU]/mL — AB (ref 0.35–4.50)

## 2014-09-12 ENCOUNTER — Encounter: Payer: Self-pay | Admitting: Internal Medicine

## 2014-09-12 ENCOUNTER — Ambulatory Visit (INDEPENDENT_AMBULATORY_CARE_PROVIDER_SITE_OTHER): Payer: BLUE CROSS/BLUE SHIELD | Admitting: Internal Medicine

## 2014-09-12 ENCOUNTER — Ambulatory Visit (INDEPENDENT_AMBULATORY_CARE_PROVIDER_SITE_OTHER)
Admission: RE | Admit: 2014-09-12 | Discharge: 2014-09-12 | Disposition: A | Discharge status: Home / Self Care | Payer: BLUE CROSS/BLUE SHIELD | Source: Ambulatory Visit | Attending: Internal Medicine | Admitting: Internal Medicine

## 2014-09-12 VITALS — BP 118/80 | HR 57 | Temp 97.6°F | Resp 16 | Ht 66.0 in | Wt 167.1 lb

## 2014-09-12 DIAGNOSIS — R0600 Dyspnea, unspecified: Secondary | ICD-10-CM

## 2014-09-12 DIAGNOSIS — E0590 Thyrotoxicosis, unspecified without thyrotoxic crisis or storm: Secondary | ICD-10-CM | POA: Diagnosis not present

## 2014-09-12 DIAGNOSIS — Z0000 Encounter for general adult medical examination without abnormal findings: Secondary | ICD-10-CM

## 2014-09-12 NOTE — Progress Notes (Signed)
Subjective:    Patient ID: Peter Clark, male    DOB: 01-Aug-1950, 64 y.o.   MRN: [id number]  HPI  Here for wellness and f/u;  Overall doing ok;  Pt denies Chest pain, worsening wheezing, orthopnea, PND, worsening LE edema, palpitations, dizziness or syncope.  Pt denies neurological change such as new headache, facial or extremity weakness.  Pt denies polydipsia, polyuria, or low sugar symptoms. Pt states overall good compliance with treatment and medications, good tolerability, and has been trying to follow appropriate diet.  Pt denies worsening depressive symptoms, suicidal ideation or panic. No fever, night sweats, wt loss, loss of appetite, or other constitutional symptoms.  Pt states good ability with ADL's, has low fall risk, home safety reviewed and adequate, no other significant changes in hearing or vision, and only occasionally active with exercise. Did have dizziness, weakness,sob, wt loss in last few months, recent TSH very low, then improved somewhat this visit with stopping his thyroid replcement. Wfie remarks she thinks he possibly took too mnay pills by accident.   Symptoms resolved with stopping his thyroid med Past Medical History  Diagnosis Date  . Hyperlipidemia   . Hypertension   . BPH (benign prostatic hypertrophy)   . Hypothyroidism   . Back pain   . Wears glasses   . Peyronie disease   . Allergic rhinitis, cause unspecified 02/14/2014   Past Surgical History  Procedure Laterality Date  . Inguinal hernia repair Right AS TEEN  . Nesbit procedure N/A 09/18/2013    Procedure: 16. PLICATION PROCEDURE;  Surgeon: Garnett FarmMark C Ottelin, MD;  [facility];  Service: Urology;  Laterality: N/A;    reports that he quit smoking about 26 years ago. His smoking use included Cigarettes. He has never used smokeless tobacco. He reports that he does not drink alcohol or use illicit drugs. family history includes Stroke in his mother. No Known Allergies Current  Outpatient Prescriptions on File Prior to Visit  Medication Sig Dispense Refill  . aspirin 81 MG tablet Take 81 mg by mouth daily.      . cetirizine (ZYRTEC) 10 MG tablet Take 1 tablet (10 mg total) by mouth daily. As needed for allergies 30 tablet 11  . fluticasone (FLONASE) 50 MCG/ACT nasal spray Place 2 sprays into both nostrils daily. 16 g 2  . lisinopril (PRINIVIL,ZESTRIL) 20 MG tablet Take 1 tablet (20 mg total) by mouth daily. 90 tablet 0  . simvastatin (ZOCOR) 40 MG tablet Take 1 tablet (40 mg total) by mouth at bedtime. 90 tablet 0  . tamsulosin (FLOMAX) 0.4 MG CAPS capsule Take 1 capsule (0.4 mg total) by mouth daily. 90 capsule 0  . triamcinolone cream (KENALOG) 0.1 % Apply 1 application topically 2 (two) times daily. 30 g 0   No current facility-administered medications on file prior to visit.   Review of Systems Constitutional: Negative for increased diaphoresis, other activity, appetite or siginficant weight change other than noted HENT: Negative for worsening hearing loss, ear pain, facial swelling, mouth sores and neck stiffness.   Eyes: Negative for other worsening pain, redness or visual disturbance.  Respiratory: Negative for shortness of breath and wheezing  Cardiovascular: Negative for chest pain and palpitations.  Gastrointestinal: Negative for diarrhea, blood in stool, abdominal distention or other pain Genitourinary: Negative for hematuria, flank pain or change in urine volume.  Musculoskeletal: Negative for myalgias or other joint complaints.  Skin: Negative for color change and wound or drainage.  Neurological: Negative for syncope and  numbness. other than noted Hematological: Negative for adenopathy. or other swelling Psychiatric/Behavioral: Negative for hallucinations, SI, self-injury, decreased concentration or other worsening agitation.      Objective:   Physical Exam BP 118/80 mmHg  Pulse 57  Temp(Src) 97.6 F (36.4 C) (Oral)  Resp 16  Ht  (1.676  m)  Wt 167 lb 1.3 oz (75.787 kg)  BMI 26.98 kg/m2  SpO2 97% VS noted,  Constitutional: Pt is oriented to person, place, and time. Appears well-developed and well-nourished, in no significant distress Head: Normocephalic and atraumatic.  Right Ear: External ear normal.  Left Ear: External ear normal.  Nose: Nose normal.  Mouth/Throat: Oropharynx is clear and moist.  Eyes: Conjunctivae and EOM are normal. Pupils are equal, round, and reactive to light.  Neck: Normal range of motion. Neck supple. No JVD present. No tracheal deviation present or significant neck LA or mass Cardiovascular: Normal rate, regular rhythm, normal heart sounds and intact distal pulses.   Pulmonary/Chest: Effort normal and breath sounds without rales or wheezing  Abdominal: Soft. Bowel sounds are normal. NT. No HSM  Musculoskeletal: Normal range of motion. Exhibits no edema.  Lymphadenopathy:  Has no cervical adenopathy.  Neurological: Pt is alert and oriented to person, place, and time. Pt has normal reflexes. No cranial nerve deficit. Motor grossly intact Skin: Skin is warm and dry. No rash noted.  Psychiatric:  Has normal mood and affect. Behavior is normal.      Assessment & Plan:

## 2014-09-12 NOTE — Patient Instructions (Addendum)
OK to stay off the thyroid medication at this time  Please continue all other medications as before, and refills have been done if requested.  Please have the pharmacy call with any other refills you may need.  Please continue your efforts at being more active, low cholesterol diet, and weight control.  You are otherwise up to date with prevention measures today.  Please keep your appointments with your specialists as you may have planned  Please return for LAB ONLY only in 4 wks  We may need to refer to endocrinology if still abnormal  Please go to the XRAY Department in the Basement (go straight as you get off the elevator) for the x-ray testing  You will be contacted by phone if any changes need to be made immediately.  Otherwise, you will receive a letter about your results with an explanation, but please check with MyChart first.  Please remember to sign up for MyChart if you have not done so, as this will be important to you in the future with finding out test results, communicating by private email, and scheduling acute appointments online when needed.  Please return in 1 year for your yearly visit, or sooner if needed, with Lab testing done 3-5 days before

## 2014-09-12 NOTE — Assessment & Plan Note (Addendum)
Overall doing well, age appropriate education and counseling updated, referrals for preventative services and immunizations addressed, dietary and smoking counseling addressed, most recent labs reviewed.  I have personally reviewed and have noted:  1) the patient's medical and social history 2) The pt's use of alcohol, tobacco, and illicit drugs 3) The patient's current medications and supplements 4) Functional ability including ADL's, fall risk, home safety risk, hearing and visual impairment 5) Diet and physical activities 6) Evidence for depression or mood disorder 7) The patient's height, weight, and BMI have been recorded in the chart  I have made referrals, and provided counseling and education based on review of the above ECG reviewed as per emr Also for PSA with next labs as was not done

## 2014-09-12 NOTE — Assessment & Plan Note (Addendum)
?   Iatrogenic vs new symptomatic overactive nodule or other - for repeat tsh/t4 in 4 wks, consider endo if still abnormal

## 2014-09-12 NOTE — Progress Notes (Signed)
Pre visit review using our clinic review tool, if applicable. No additional management support is needed unless otherwise documented below in the visit note. 

## 2014-09-13 ENCOUNTER — Encounter: Payer: Self-pay | Admitting: Internal Medicine

## 2014-09-15 DIAGNOSIS — R0600 Dyspnea, unspecified: Secondary | ICD-10-CM | POA: Insufficient documentation

## 2014-09-15 NOTE — Assessment & Plan Note (Signed)
Also for cxr - r/o lung dz

## 2014-10-03 ENCOUNTER — Telehealth: Payer: Self-pay | Admitting: Internal Medicine

## 2014-10-03 DIAGNOSIS — H9209 Otalgia, unspecified ear: Secondary | ICD-10-CM

## 2014-10-03 DIAGNOSIS — H9319 Tinnitus, unspecified ear: Secondary | ICD-10-CM

## 2014-10-03 NOTE — Telephone Encounter (Signed)
They are requesting Dr. Jonny RuizJohn send patient to a specialist that could treat patient not being able to ear and noise in ear.  Information that patient has for who they wanted to go to is not correct.  They do not know name or have correct phone number.  Who ever can see patient they will go with.

## 2014-10-03 NOTE — Telephone Encounter (Signed)
Wife states patient can not hear good and he hears noise.  Tried to make an appointment with a Dr. Blanchie DessertSuw at phone number [phone] but office is requesting a referral.  Patient would like a rush put on this.

## 2014-10-03 NOTE — Telephone Encounter (Signed)
I need to know the medical speciality to be able to do this (? ENT or audiology??)

## 2014-10-03 NOTE — Telephone Encounter (Signed)
Patient would like Dr. Jonny RuizJohn to call them if possible.  States that Dr. Jonny RuizJohn gave them an ear drop.  States patient finished drops and issues came back again.

## 2014-10-03 NOTE — Telephone Encounter (Signed)
Very sorry, I do not do phone consultations  I can refer to ENT however, thanks

## 2014-10-04 NOTE — Telephone Encounter (Signed)
Left vm on patients phone with provider response

## 2014-10-10 ENCOUNTER — Encounter: Payer: Self-pay | Admitting: Internal Medicine

## 2014-10-10 ENCOUNTER — Other Ambulatory Visit: Payer: Self-pay | Admitting: Internal Medicine

## 2014-10-10 ENCOUNTER — Other Ambulatory Visit (INDEPENDENT_AMBULATORY_CARE_PROVIDER_SITE_OTHER): Payer: BLUE CROSS/BLUE SHIELD

## 2014-10-10 DIAGNOSIS — E039 Hypothyroidism, unspecified: Secondary | ICD-10-CM

## 2014-10-10 DIAGNOSIS — Z0000 Encounter for general adult medical examination without abnormal findings: Secondary | ICD-10-CM | POA: Diagnosis not present

## 2014-10-10 DIAGNOSIS — E785 Hyperlipidemia, unspecified: Secondary | ICD-10-CM

## 2014-10-10 LAB — LIPID PANEL
CHOLESTEROL: 205 mg/dL — AB (ref 0–200)
HDL: 45 mg/dL (ref >39.00)
LDL Cholesterol: 132 mg/dL — ABNORMAL HIGH (ref 0–99)
NonHDL: 160
Total CHOL/HDL Ratio: 5
Triglycerides: 139 mg/dL (ref 0.0–149.0)
VLDL: 27.8 mg/dL (ref 0.0–40.0)

## 2014-10-10 LAB — PSA: PSA: 0.81 ng/mL (ref 0.10–4.00)

## 2014-10-10 LAB — CBC WITH DIFFERENTIAL/PLATELET
Basophils Absolute: 0 10*3/uL (ref 0.0–0.1)
Basophils Relative: 0.6 % (ref 0.0–3.0)
EOS PCT: 3.1 % (ref 0.0–5.0)
Eosinophils Absolute: 0.2 10*3/uL (ref 0.0–0.7)
HEMATOCRIT: 43.1 % (ref 39.0–52.0)
Hemoglobin: 14.8 g/dL (ref 13.0–17.0)
LYMPHS ABS: 2 10*3/uL (ref 0.7–4.0)
Lymphocytes Relative: 30.5 % (ref 12.0–46.0)
MCHC: 34.3 g/dL (ref 30.0–36.0)
MCV: 86.9 fl (ref 78.0–100.0)
MONOS PCT: 8.8 % (ref 3.0–12.0)
Monocytes Absolute: 0.6 10*3/uL (ref 0.1–1.0)
NEUTROS ABS: 3.8 10*3/uL (ref 1.4–7.7)
NEUTROS PCT: 57 % (ref 43.0–77.0)
Platelets: 168 10*3/uL (ref 150.0–400.0)
RBC: 4.96 Mil/uL (ref 4.22–5.81)
RDW: 15.1 % (ref 11.5–15.5)
WBC: 6.7 10*3/uL (ref 4.0–10.5)

## 2014-10-10 LAB — BASIC METABOLIC PANEL
BUN: 11 mg/dL (ref 6–23)
CHLORIDE: 105 meq/L (ref 96–112)
CO2: 28 mEq/L (ref 19–32)
Calcium: 9.2 mg/dL (ref 8.4–10.5)
Creatinine, Ser: 1.21 mg/dL (ref 0.40–1.50)
GFR: 64.19 mL/min (ref >60.00)
GLUCOSE: 100 mg/dL — AB (ref 70–99)
Potassium: 4.1 mEq/L (ref 3.5–5.1)
Sodium: 139 mEq/L (ref 135–145)

## 2014-10-10 LAB — URINALYSIS, ROUTINE W REFLEX MICROSCOPIC
BILIRUBIN URINE: NEGATIVE
HGB URINE DIPSTICK: NEGATIVE
KETONES UR: NEGATIVE
Leukocytes, UA: NEGATIVE
NITRITE: NEGATIVE
RBC / HPF: NONE SEEN (ref 0–?)
Specific Gravity, Urine: 1.02 (ref 1.000–1.030)
Total Protein, Urine: NEGATIVE
Urine Glucose: NEGATIVE
Urobilinogen, UA: 0.2 (ref 0.0–1.0)
WBC, UA: NONE SEEN (ref 0–?)
pH: 6.5 (ref 5.0–8.0)

## 2014-10-10 LAB — TSH: TSH: >100 u[IU]/mL — ABNORMAL HIGH (ref 0.35–4.50)

## 2014-10-10 LAB — HEPATIC FUNCTION PANEL
ALBUMIN: 4.3 g/dL (ref 3.5–5.2)
ALT: 29 U/L (ref 0–53)
AST: 32 U/L (ref 0–37)
Alkaline Phosphatase: 77 U/L (ref 39–117)
BILIRUBIN DIRECT: 0.2 mg/dL (ref 0.0–0.3)
TOTAL PROTEIN: 7 g/dL (ref 6.0–8.3)
Total Bilirubin: 0.9 mg/dL (ref 0.2–1.2)

## 2014-10-10 MED ORDER — LEVOTHYROXINE SODIUM 100 MCG PO TABS: 100.0000 ug | ORAL_TABLET | Freq: Every day | ORAL | Status: DC

## 2014-10-10 MED ORDER — SIMVASTATIN 40 MG PO TABS: 40.0000 mg | ORAL_TABLET | Freq: Every day | ORAL | Status: DC

## 2014-10-11 ENCOUNTER — Encounter: Payer: Self-pay | Admitting: Internal Medicine

## 2014-10-11 ENCOUNTER — Ambulatory Visit (INDEPENDENT_AMBULATORY_CARE_PROVIDER_SITE_OTHER): Payer: BLUE CROSS/BLUE SHIELD | Admitting: Internal Medicine

## 2014-10-11 VITALS — BP 122/82 | HR 68 | Temp 98.4°F | Resp 18 | Ht 66.0 in | Wt 175.0 lb

## 2014-10-11 DIAGNOSIS — E039 Hypothyroidism, unspecified: Secondary | ICD-10-CM | POA: Insufficient documentation

## 2014-10-11 DIAGNOSIS — I10 Essential (primary) hypertension: Secondary | ICD-10-CM

## 2014-10-11 DIAGNOSIS — E785 Hyperlipidemia, unspecified: Secondary | ICD-10-CM | POA: Diagnosis not present

## 2014-10-11 MED ORDER — LEVOTHYROXINE SODIUM 50 MCG PO TABS: ORAL_TABLET | ORAL | Status: DC

## 2014-10-11 NOTE — Patient Instructions (Signed)
OK to take the 25 mcg of thyroid medication for now (half of the 50 mcg pills you have)  You will be contacted regarding the referral for: endocrinology  Please continue all other medications as before, and refills have been done if requested.  Please have the pharmacy call with any other refills you may need.  Please keep your appointments with your specialists as you may have planned

## 2014-10-11 NOTE — Assessment & Plan Note (Signed)
Pt with apparently low TSH on 25 mcg qd levothyroxine, then later > 100 on no med use at all.  ? Medication admin problem?  I cant understand how low dose levothyroxine can cause such drastic change in TSH.  Ok to re-start low dose, take as prescribed daily, also refer to Endo per wife request as well.

## 2014-10-11 NOTE — Assessment & Plan Note (Signed)
Ok to start the simvastatin 40 qd,  to f/u any worsening symptoms or concerns, cont low chol diet

## 2014-10-11 NOTE — Assessment & Plan Note (Signed)
stable overall by history and exam, recent data reviewed with pt, and pt to continue medical treatment as before,  to f/u any worsening symptoms or concerns BP Readings from Last 3 Encounters:  10/11/14 122/82  09/12/14 118/80  08/13/14 124/86

## 2014-10-11 NOTE — Progress Notes (Signed)
Subjective:    Patient ID: Peter Clark, male    DOB: [birth year], 64 y.o.   MRN: [id number]  HPI  Here to f/u, concerned about high dose thyroid medication prescribed.  Denies hyper or hypo thyroid symptoms such as voice, skin or hair change, but had low TSH with 25 mcg qd, then later TSH > 100 with last labs.  Seems to have some difficulty with understanding and taking his meds, but he denies this. LDL cholesterol twice last lab value but also denies diet change.  Pt denies chest pain, increased sob or doe, wheezing, orthopnea, PND, increased LE swelling, palpitations, dizziness or syncope.  Pt denies new neurological symptoms such as new headache, or facial or extremity weakness or numbness   Pt denies polydipsia, polyuria.   Past Medical History  Diagnosis Date  . Hyperlipidemia   . Hypertension   . BPH (benign prostatic hypertrophy)   . Hypothyroidism   . Back pain   . Wears glasses   . Peyronie disease   . Allergic rhinitis, cause unspecified 02/14/2014   Past Surgical History  Procedure Laterality Date  . Inguinal hernia repair Right AS TEEN  . Nesbit procedure N/A 09/18/2013    Procedure: 16. PLICATION PROCEDURE;  Surgeon: Garnett FarmMark C Ottelin, MD;  [facility] [place];  Service: Urology;  Laterality: N/A;    reports that he quit smoking about 26 years ago. His smoking use included Cigarettes. He has never used smokeless tobacco. He reports that he does not drink alcohol or use illicit drugs. family history includes Stroke in his mother. No Known Allergies Current Outpatient Prescriptions on File Prior to Visit  Medication Sig Dispense Refill  . aspirin 81 MG tablet Take 81 mg by mouth daily.      . cetirizine (ZYRTEC) 10 MG tablet Take 1 tablet (10 mg total) by mouth daily. As needed for allergies 30 tablet 11  . fluticasone (FLONASE) 50 MCG/ACT nasal spray Place 2 sprays into both nostrils daily. 16 g 2  . lisinopril (PRINIVIL,ZESTRIL) 20 MG tablet Take 1  tablet (20 mg total) by mouth daily. 90 tablet 0  . simvastatin (ZOCOR) 40 MG tablet Take 1 tablet (40 mg total) by mouth at bedtime. 90 tablet 3  . tamsulosin (FLOMAX) 0.4 MG CAPS capsule Take 1 capsule (0.4 mg total) by mouth daily. 90 capsule 0  . triamcinolone cream (KENALOG) 0.1 % Apply 1 application topically 2 (two) times daily. 30 g 0   No current facility-administered medications on file prior to visit.     Review of Systems  Constitutional: Negative for unusual diaphoresis or night sweats HENT: Negative for ringing in ear or discharge Eyes: Negative for double vision or worsening visual disturbance.  Respiratory: Negative for choking and stridor.   Gastrointestinal: Negative for vomiting or other signifcant bowel change Genitourinary: Negative for hematuria or change in urine volume.  Musculoskeletal: Negative for other MSK pain or swelling Skin: Negative for color change and worsening wound.  Neurological: Negative for tremors and numbness other than noted  Psychiatric/Behavioral: Negative for decreased concentration or agitation other than above       Objective:   Physical Exam BP 122/82 mmHg  Pulse 68  Temp(Src) 98.4 F (36.9 C) (Oral)  Resp 18  Ht 5\' 6"  (1.676 m)  Wt 175 lb (79.379 kg)  BMI 28.26 kg/m2  SpO2 98% VS noted, not ill appearing Constitutional: Pt appears in no significant distress HENT: Head: NCAT.  Right Ear: External ear normal.  Left Ear: External ear normal.  Eyes: . Pupils are equal, round, and reactive to light. Conjunctivae and EOM are normal Neck: Normal range of motion. Neck supple.  Cardiovascular: Normal rate and regular rhythm.   Pulmonary/Chest: Effort normal and breath sounds without rales or wheezing.  Abd:  Soft, NT, ND, + BS Neurological: Pt is alert. Not confused , motor grossly intact Skin: Skin is warm. No rash, no LE edema Psychiatric: Pt behavior is normal. No agitation.     Assessment & Plan:

## 2014-10-26 ENCOUNTER — Ambulatory Visit (INDEPENDENT_AMBULATORY_CARE_PROVIDER_SITE_OTHER): Payer: BLUE CROSS/BLUE SHIELD | Admitting: Endocrinology

## 2014-10-26 ENCOUNTER — Encounter: Payer: Self-pay | Admitting: Endocrinology

## 2014-10-26 VITALS — BP 130/88 | HR 80 | Temp 97.5°F | Wt 170.0 lb

## 2014-10-26 DIAGNOSIS — E039 Hypothyroidism, unspecified: Secondary | ICD-10-CM

## 2014-10-26 LAB — TSH: TSH: 90.22 u[IU]/mL — ABNORMAL HIGH (ref 0.35–4.50)

## 2014-10-26 LAB — T4, FREE: Free T4: 0.49 ng/dL — ABNORMAL LOW (ref 0.60–1.60)

## 2014-10-26 NOTE — Progress Notes (Signed)
Subjective:    Patient ID: Peter Clark, male    DOB: May 21, 1951, 64 y.o.   MRN: [id number]  HPI Pt reports hypothyroidism was dx'ed in 2008.  He has been on prescribed thyroid hormone therapy since then.  He has never taken kelp or any other type of non-prescribed thyroid product.  He has never had thyroid imaging.  He has never had thyroid surgery, or XRT to the neck.  He has never been on amiodarone or lithium.  He took synthroid 50 mcg/day until early 2016, when TSH was low.  He had slight dyspnea sensation in the chest, and assoc weight loss.  It was again low after reducing to 25/d, so it was stopped altogether.  F/u TSH was very high, so it was resumed at 100 mcg/d.  However, pt says he currently takes 50 mcg, 1/2 tab qd.   Past Medical History  Diagnosis Date  . Hyperlipidemia   . Hypertension   . BPH (benign prostatic hypertrophy)   . Hypothyroidism   . Back pain   . Wears glasses   . Peyronie disease   . Allergic rhinitis, cause unspecified 02/14/2014    Past Surgical History  Procedure Laterality Date  . Inguinal hernia repair Right AS TEEN  . Nesbit procedure N/A 09/18/2013    Procedure: 16. PLICATION PROCEDURE;  Surgeon: Garnett FarmMark C Ottelin, MD;  [facility];  Service: Urology;  Laterality: N/A;    History   Social History  . Marital Status: Married    Spouse Name: N/A  . Number of Children: 2  . Years of Education: N/A   Occupational History  . Not on file.   Social History Main Topics  . Smoking status: Former Smoker    Types: Cigarettes    Quit date: 09/11/1988  . Smokeless tobacco: Never Used  . Alcohol Use: No  . Drug Use: No  . Sexual Activity: Not on file   Other Topics Concern  . Not on file   Social History Narrative    Current Outpatient Prescriptions on File Prior to Visit  Medication Sig Dispense Refill  . aspirin 81 MG tablet Take 81 mg by mouth daily.      . cetirizine (ZYRTEC) 10 MG tablet Take 1 tablet (10 mg  total) by mouth daily. As needed for allergies 30 tablet 11  . fluticasone (FLONASE) 50 MCG/ACT nasal spray Place 2 sprays into both nostrils daily. 16 g 2  . lisinopril (PRINIVIL,ZESTRIL) 20 MG tablet Take 1 tablet (20 mg total) by mouth daily. 90 tablet 0  . simvastatin (ZOCOR) 40 MG tablet Take 1 tablet (40 mg total) by mouth at bedtime. 90 tablet 3  . tamsulosin (FLOMAX) 0.4 MG CAPS capsule Take 1 capsule (0.4 mg total) by mouth daily. 90 capsule 0  . triamcinolone cream (KENALOG) 0.1 % Apply 1 application topically 2 (two) times daily. 30 g 0   No current facility-administered medications on file prior to visit.    No Known Allergies  Family History  Problem Relation Age of Onset  . Stroke Mother   . Thyroid disease Sister     BP 130/88 mmHg  Pulse 80  Temp(Src) 97.5 F (36.4 C) (Oral)  Wt 170 lb (77.111 kg)  SpO2 98%  Review of Systems No recent weight change, dry skin, muscle cramps, or depression.      Objective:   Physical Exam VITAL SIGNS:  See vs page GENERAL: no distress. NECK: There is no palpable thyroid enlargement.  No thyroid nodule is palpable.  No palpable lymphadenopathy at the anterior neck. Skin: normal texture and temp Neuro: no tremor PSYCH: Does not appear anxious nor depressed.   Lab Results  Component Value Date   TSH 90.22* 10/26/2014   i personally reviewed electrocardiogram tracing (09/18/13): normal  i have reviewed records from dr Jonny Ruiz: he says: pt seems to have some difficulty with understanding and taking his meds    Assessment & Plan:  Hypothyroidism: severe exacerbation Doe, new to me, uncertain is thyroid-related.  i'll ask pt again when thyroid is better controlled.  Noncompliance with [facility] recording: we discussed. Pt says he'll take meds as rx'ed   Addendum: i have sent a prescription to your pharmacy, to increase to 100 mcg/day.  Please come back for a follow-up appointment in 6 weeks.   Patient is advised the  following: Patient Instructions  Thyroid blood tests are requested for you today.  We'll let you know about the results.    Hypothyroidism The thyroid is a large gland located in the lower front of your neck. The thyroid gland helps control metabolism. Metabolism is how your body handles food. It controls metabolism with the hormone thyroxine. When this gland is underactive (hypothyroid), it produces too little hormone.  CAUSES These include:   Absence or destruction of thyroid tissue.  Goiter due to iodine deficiency.  Goiter due to medications.  Congenital defects (since birth).  Problems with the pituitary. This causes a lack of TSH (thyroid stimulating hormone). This hormone tells the thyroid to turn out more hormone. SYMPTOMS  Lethargy (feeling as though you have no energy)  Cold intolerance  Weight gain (in spite of normal food intake)  Dry skin  Coarse hair  Menstrual irregularity (if severe, may lead to infertility)  Slowing of thought processes Cardiac problems are also caused by insufficient amounts of thyroid hormone. Hypothyroidism in the newborn is cretinism, and is an extreme form. It is important that this form be treated adequately and immediately or it will lead rapidly to retarded physical and mental development. DIAGNOSIS  To prove hypothyroidism, your caregiver may do blood tests and ultrasound tests. Sometimes the signs are hidden. It may be necessary for your caregiver to watch this illness with blood tests either before or after diagnosis and treatment. TREATMENT  Low levels of thyroid hormone are increased by using synthetic thyroid hormone. This is a safe, effective treatment. It usually takes about four weeks to gain the full effects of the medication. After you have the full effect of the medication, it will generally take another four weeks for problems to leave. Your caregiver may start you on low doses. If you have had heart problems the dose may be  gradually increased. It is generally not an emergency to get rapidly to normal. HOME CARE INSTRUCTIONS   Take your medications as your caregiver suggests. Let your caregiver know of any medications you are taking or start taking. Your caregiver will help you with dosage schedules.  As your condition improves, your dosage needs may increase. It will be necessary to have continuing blood tests as suggested by your caregiver.  Report all suspected medication side effects to your caregiver. SEEK MEDICAL CARE IF: Seek medical care if you develop:  Sweating.  Tremulousness (tremors).  Anxiety.  Rapid weight loss.  Heat intolerance.  Emotional swings.  Diarrhea.  Weakness. SEEK IMMEDIATE MEDICAL CARE IF:  You develop chest pain, an irregular heart beat (palpitations), or a rapid heart beat. MAKE SURE  YOU:   Understand these instructions.  Will watch your condition.  Will get help right away if you are not doing well or get worse. Document Released: 05/25/2005 Document Revised: 08/17/2011 Document Reviewed: 01/13/2008 [facility] Patient Information 2015 [place], [place]. This information is not intended to replace advice given to you by your health care provider. Make sure you discuss any questions you have with your health care provider. Levothyroxine oral capsules What is this medicine? LEVOTHYROXINE (lee voe thye ROX een) is a thyroid hormone. This medicine can improve symptoms of thyroid deficiency such as slow speech, lack of energy, weight gain, hair loss, dry skin, and feeling cold. It also helps to treat goiter (an enlarged thyroid gland). It is also used to treat some kinds of thyroid cancer along with surgery and other medicines. This medicine may be used for other purposes; ask your health care provider or pharmacist if you have questions. COMMON BRAND NAME(S): Tirosint What should I tell my health care provider before I take this medicine? They need to know if you have any  of these conditions: -angina -blood clotting problems -diabetes -dieting or on a weight loss program -fertility problems -heart disease -high levels of thyroid hormone -pituitary gland problem -previous heart attack -an unusual or allergic reaction to levothyroxine, thyroid hormones, other medicines, foods, dyes, or preservatives -pregnant or trying to get pregnant -breast-feeding How should I use this medicine? Take this medicine by mouth with a glass of water. It is best to take on an empty stomach, at least 30 minutes to one hour before breakfast. Avoid taking antacids containing aluminum or magnesium, simethicone, bile acid sequestrants, calcium carbonate, sodium polystyrene sulfonate, ferrous sulfate, and sucralfate within 4 hours of taking this medicine. Do not cut, crush or chew this medicine. Follow the directions on the prescription label. Take at the same time each day. Do not take your medicine more often than directed. Talk to your pediatrician regarding the use of this medicine in children. While this drug may be prescribed for selected conditions, precautions do apply. Since the capsules cannot be crushed or placed in water, they may only be given to infants and children who are able to swallow an intact capsule. Overdosage: If you think you've taken too much of this medicine contact a poison control center or emergency room at once. Overdosage: If you think you have taken too much of this medicine contact a poison control center or emergency room at once. NOTE: This medicine is only for you. Do not share this medicine with others. What if I miss a dose? If you miss a dose, take it as soon as you can. If it is almost time for your next dose, take only that dose. Do not take double or extra doses. What may interact with this medicine? -amiodarone -antacids -anti-thyroid medicines -calcium supplements -carbamazepine -cholestyramine -colestipol -digoxin -male hormones,  including contraceptive or birth control pills -iron supplements -ketamine -liquid nutrition products like Ensure -medicines for colds and breathing difficulties -medicines for diabetes -medicines for mental depression -medicines or herbals used to decrease weight or appetite -phenobarbital or other barbiturate medications -phenytoin -prednisone or other corticosteroids -rifabutin -rifampin -simethicone -sodium polystyrene sulfonate -soy isoflavones -sucralfate -theophylline -warfarin This list may not describe all possible interactions. Give your health care provider a list of all the medicines, herbs, non-prescription drugs, or dietary supplements you use. Also tell them if you smoke, drink alcohol, or use illegal drugs. Some items may interact with your medicine. What should I watch for  while using this medicine? Do not switch brands of this medicine unless your health care professional agrees with the change. Ask questions if you are uncertain. You will need regular exams and occasional blood tests to check the response to treatment. If you are receiving this medicine for an underactive thyroid, it may be several weeks before you notice an improvement. Check with your doctor or health care professional if your symptoms do not improve. It may be necessary for you to take this medicine for the rest of your life. Do not stop using this medicine unless your doctor or health care professional advises you to. This medicine can affect blood sugar levels. If you have diabetes, check your blood sugar as directed. You may lose some of your hair when you first start treatment. With time, this usually corrects itself. If you are going to have surgery, tell your doctor or health care professional that you are taking this medicine. What side effects may I notice from receiving this medicine? Side effects that you should report to your doctor or health care professional as soon as  possible: -allergic reactions like skin rash, itching or hives, swelling of the face, lips, or tongue -chest pain -excessive sweating or intolerance to heat -fast or irregular heartbeat -nervousness -swelling of ankles, feet, or legs -tremors Side effects that usually do not require medical attention (Report these to your doctor or health care professional if they continue or are bothersome.): -changes in appetite -changes in menstrual periods -diarrhea -hair loss -headache -trouble sleeping -weight loss This list may not describe all possible side effects. Call your doctor for medical advice about side effects. You may report side effects to FDA at 1-800-FDA-1088. Where should I keep my medicine? Keep out of the reach of children. Store at room temperature between 15 and 30 degrees C (59 and 86 degrees F). Protect from light and moisture. Keep container tightly closed. Throw away any unused medicine after the expiration date. NOTE: This sheet is a summary. It may not cover all possible information. If you have questions about this medicine, talk to your doctor, pharmacist, or health care provider.  2015, Elsevier/Gold Standard. (2008-08-16 15:25:58)

## 2014-10-26 NOTE — Patient Instructions (Addendum)
Thyroid blood tests are requested for you today.  We'll let you know about the results.    Hypothyroidism The thyroid is a large gland located in the lower front of your neck. The thyroid gland helps control metabolism. Metabolism is how your body handles food. It controls metabolism with the hormone thyroxine. When this gland is underactive (hypothyroid), it produces too little hormone.  CAUSES These include:   Absence or destruction of thyroid tissue.  Goiter due to iodine deficiency.  Goiter due to medications.  Congenital defects (since birth).  Problems with the pituitary. This causes a lack of TSH (thyroid stimulating hormone). This hormone tells the thyroid to turn out more hormone. SYMPTOMS  Lethargy (feeling as though you have no energy)  Cold intolerance  Weight gain (in spite of normal food intake)  Dry skin  Coarse hair  Menstrual irregularity (if severe, may lead to infertility)  Slowing of thought processes Cardiac problems are also caused by insufficient amounts of thyroid hormone. Hypothyroidism in the newborn is cretinism, and is an extreme form. It is important that this form be treated adequately and immediately or it will lead rapidly to retarded physical and mental development. DIAGNOSIS  To prove hypothyroidism, your caregiver may do blood tests and ultrasound tests. Sometimes the signs are hidden. It may be necessary for your caregiver to watch this illness with blood tests either before or after diagnosis and treatment. TREATMENT  Low levels of thyroid hormone are increased by using synthetic thyroid hormone. This is a safe, effective treatment. It usually takes about four weeks to gain the full effects of the medication. After you have the full effect of the medication, it will generally take another four weeks for problems to leave. Your caregiver may start you on low doses. If you have had heart problems the dose may be gradually increased. It is  generally not an emergency to get rapidly to normal. HOME CARE INSTRUCTIONS   Take your medications as your caregiver suggests. Let your caregiver know of any medications you are taking or start taking. Your caregiver will help you with dosage schedules.  As your condition improves, your dosage needs may increase. It will be necessary to have continuing blood tests as suggested by your caregiver.  Report all suspected medication side effects to your caregiver. SEEK MEDICAL CARE IF: Seek medical care if you develop:  Sweating.  Tremulousness (tremors).  Anxiety.  Rapid weight loss.  Heat intolerance.  Emotional swings.  Diarrhea.  Weakness. SEEK IMMEDIATE MEDICAL CARE IF:  You develop chest pain, an irregular heart beat (palpitations), or a rapid heart beat. MAKE SURE YOU:   Understand these instructions.  Will watch your condition.  Will get help right away if you are not doing well or get worse. Document Released: 05/25/2005 Document Revised: 08/17/2011 Document Reviewed: 01/13/2008 [facility] Patient Information 2015 [place], [place]. This information is not intended to replace advice given to you by your health care provider. Make sure you discuss any questions you have with your health care provider. Levothyroxine oral capsules What is this medicine? LEVOTHYROXINE (lee voe thye ROX een) is a thyroid hormone. This medicine can improve symptoms of thyroid deficiency such as slow speech, lack of energy, weight gain, hair loss, dry skin, and feeling cold. It also helps to treat goiter (an enlarged thyroid gland). It is also used to treat some kinds of thyroid cancer along with surgery and other medicines. This medicine may be used for other purposes; ask your health care provider  or pharmacist if you have questions. COMMON BRAND NAME(S): Tirosint What should I tell my health care provider before I take this medicine? They need to know if you have any of these  conditions: -angina -blood clotting problems -diabetes -dieting or on a weight loss program -fertility problems -heart disease -high levels of thyroid hormone -pituitary gland problem -previous heart attack -an unusual or allergic reaction to levothyroxine, thyroid hormones, other medicines, foods, dyes, or preservatives -pregnant or trying to get pregnant -breast-feeding How should I use this medicine? Take this medicine by mouth with a glass of water. It is best to take on an empty stomach, at least 30 minutes to one hour before breakfast. Avoid taking antacids containing aluminum or magnesium, simethicone, bile acid sequestrants, calcium carbonate, sodium polystyrene sulfonate, ferrous sulfate, and sucralfate within 4 hours of taking this medicine. Do not cut, crush or chew this medicine. Follow the directions on the prescription label. Take at the same time each day. Do not take your medicine more often than directed. Talk to your pediatrician regarding the use of this medicine in children. While this drug may be prescribed for selected conditions, precautions do apply. Since the capsules cannot be crushed or placed in water, they may only be given to infants and children who are able to swallow an intact capsule. Overdosage: If you think you've taken too much of this medicine contact a poison control center or emergency room at once. Overdosage: If you think you have taken too much of this medicine contact a poison control center or emergency room at once. NOTE: This medicine is only for you. Do not share this medicine with others. What if I miss a dose? If you miss a dose, take it as soon as you can. If it is almost time for your next dose, take only that dose. Do not take double or extra doses. What may interact with this medicine? -amiodarone -antacids -anti-thyroid medicines -calcium supplements -carbamazepine -cholestyramine -colestipol -digoxin -male hormones, including  contraceptive or birth control pills -iron supplements -ketamine -liquid nutrition products like Ensure -medicines for colds and breathing difficulties -medicines for diabetes -medicines for mental depression -medicines or herbals used to decrease weight or appetite -phenobarbital or other barbiturate medications -phenytoin -prednisone or other corticosteroids -rifabutin -rifampin -simethicone -sodium polystyrene sulfonate -soy isoflavones -sucralfate -theophylline -warfarin This list may not describe all possible interactions. Give your health care provider a list of all the medicines, herbs, non-prescription drugs, or dietary supplements you use. Also tell them if you smoke, drink alcohol, or use illegal drugs. Some items may interact with your medicine. What should I watch for while using this medicine? Do not switch brands of this medicine unless your health care professional agrees with the change. Ask questions if you are uncertain. You will need regular exams and occasional blood tests to check the response to treatment. If you are receiving this medicine for an underactive thyroid, it may be several weeks before you notice an improvement. Check with your doctor or health care professional if your symptoms do not improve. It may be necessary for you to take this medicine for the rest of your life. Do not stop using this medicine unless your doctor or health care professional advises you to. This medicine can affect blood sugar levels. If you have diabetes, check your blood sugar as directed. You may lose some of your hair when you first start treatment. With time, this usually corrects itself. If you are going to have surgery, tell your  doctor or health care professional that you are taking this medicine. What side effects may I notice from receiving this medicine? Side effects that you should report to your doctor or health care professional as soon as possible: -allergic  reactions like skin rash, itching or hives, swelling of the face, lips, or tongue -chest pain -excessive sweating or intolerance to heat -fast or irregular heartbeat -nervousness -swelling of ankles, feet, or legs -tremors Side effects that usually do not require medical attention (Report these to your doctor or health care professional if they continue or are bothersome.): -changes in appetite -changes in menstrual periods -diarrhea -hair loss -headache -trouble sleeping -weight loss This list may not describe all possible side effects. Call your doctor for medical advice about side effects. You may report side effects to FDA at 1-800-FDA-1088. Where should I keep my medicine? Keep out of the reach of children. Store at room temperature between 15 and 30 degrees C (59 and 86 degrees F). Protect from light and moisture. Keep container tightly closed. Throw away any unused medicine after the expiration date. NOTE: This sheet is a summary. It may not cover all possible information. If you have questions about this medicine, talk to your doctor, pharmacist, or health care provider.  2015, Elsevier/Gold Standard. (2008-08-16 15:25:58)

## 2014-10-27 MED ORDER — LEVOTHYROXINE SODIUM 100 MCG PO TABS: 100.0000 ug | ORAL_TABLET | Freq: Every day | ORAL | Status: DC

## 2014-11-14 ENCOUNTER — Ambulatory Visit (INDEPENDENT_AMBULATORY_CARE_PROVIDER_SITE_OTHER): Payer: BLUE CROSS/BLUE SHIELD | Admitting: *Deleted

## 2014-11-14 DIAGNOSIS — Z23 Encounter for immunization: Secondary | ICD-10-CM

## 2014-11-30 ENCOUNTER — Telehealth: Payer: Self-pay | Admitting: Internal Medicine

## 2014-11-30 ENCOUNTER — Other Ambulatory Visit: Payer: Self-pay | Admitting: Internal Medicine

## 2014-11-30 NOTE — Telephone Encounter (Signed)
Patient is calling to request lisinopril (PRINIVIL,ZESTRIL) 20 MG tablet [[id number]]. He would like to have it there by noon.

## 2014-11-30 NOTE — Telephone Encounter (Signed)
rx sent in electronically 

## 2014-12-06 ENCOUNTER — Other Ambulatory Visit: Payer: Self-pay | Admitting: Internal Medicine

## 2014-12-06 ENCOUNTER — Telehealth: Payer: Self-pay | Admitting: Internal Medicine

## 2014-12-06 NOTE — Telephone Encounter (Signed)
Patient [facility]  Patient states he is out of medication.

## 2014-12-06 NOTE — Telephone Encounter (Signed)
Already E-Rx'd

## 2014-12-24 ENCOUNTER — Telehealth: Payer: Self-pay | Admitting: Internal Medicine

## 2014-12-24 NOTE — Telephone Encounter (Signed)
Error

## 2015-01-14 ENCOUNTER — Other Ambulatory Visit: Payer: Self-pay | Admitting: Endocrinology

## 2015-01-14 ENCOUNTER — Other Ambulatory Visit: Payer: Self-pay | Admitting: Internal Medicine

## 2015-03-15 ENCOUNTER — Ambulatory Visit (INDEPENDENT_AMBULATORY_CARE_PROVIDER_SITE_OTHER): Payer: BLUE CROSS/BLUE SHIELD

## 2015-03-15 DIAGNOSIS — Z23 Encounter for immunization: Secondary | ICD-10-CM

## 2015-04-02 ENCOUNTER — Other Ambulatory Visit: Payer: Self-pay | Admitting: Internal Medicine

## 2015-04-12 ENCOUNTER — Encounter: Payer: Self-pay | Admitting: Internal Medicine

## 2015-04-12 ENCOUNTER — Ambulatory Visit (INDEPENDENT_AMBULATORY_CARE_PROVIDER_SITE_OTHER): Payer: BLUE CROSS/BLUE SHIELD | Admitting: Internal Medicine

## 2015-04-12 VITALS — BP 118/74 | HR 74 | Temp 98.9°F | Ht 66.0 in | Wt 164.0 lb

## 2015-04-12 DIAGNOSIS — E039 Hypothyroidism, unspecified: Secondary | ICD-10-CM

## 2015-04-12 DIAGNOSIS — I10 Essential (primary) hypertension: Secondary | ICD-10-CM

## 2015-04-12 DIAGNOSIS — M25522 Pain in left elbow: Secondary | ICD-10-CM

## 2015-04-12 DIAGNOSIS — E785 Hyperlipidemia, unspecified: Secondary | ICD-10-CM

## 2015-04-12 DIAGNOSIS — R351 Nocturia: Secondary | ICD-10-CM | POA: Diagnosis not present

## 2015-04-12 MED ORDER — AMLODIPINE BESYLATE 5 MG PO TABS: 5.0000 mg | ORAL_TABLET | Freq: Every day | ORAL | Status: DC

## 2015-04-12 NOTE — Assessment & Plan Note (Signed)
Exam benign, but seems c/w left ulnar neuritis, ok for NCS/EMG , consider left wrist splint but he is not inclined to try this

## 2015-04-12 NOTE — Assessment & Plan Note (Signed)
For lower chol diet, cont statin, for f/u labs

## 2015-04-12 NOTE — Assessment & Plan Note (Signed)
Mild uncontrolled by pt hx, to add amlod 5 qd, cont all other tx, to f/u any worsening symptoms or concerns

## 2015-04-12 NOTE — Assessment & Plan Note (Addendum)
To cont flomax, adjust fluids during day, for UA ,  to f/u any worsening symptoms or concerns, also for repeat PSA as per pt request

## 2015-04-12 NOTE — Progress Notes (Signed)
Pre visit review using our clinic review tool, if applicable. No additional management support is needed unless otherwise documented below in the visit note. 

## 2015-04-12 NOTE — Assessment & Plan Note (Signed)
Now back on 100 mcg replacement meds, for f/u labs

## 2015-04-12 NOTE — Progress Notes (Signed)
Subjective:    Patient ID: Peter Clark, male    DOB: Jul 12, 1950, 64 y.o.   MRN: [id number]  HPI  Here to f/u; overall doing ok,  Pt denies chest pain, increasing sob or doe, wheezing, orthopnea, PND, increased LE swelling, palpitations, dizziness or syncope.  Pt denies new neurological symptoms such as new headache, or facial or extremity weakness or numbness.  Pt denies polydipsia, polyuria, or low sugar episode.   Pt denies new neurological symptoms such as new headache, or facial or extremity weakness or numbness.   Pt states overall good compliance with meds, mostly trying to follow appropriate diet, with wt overall stable,  but little exercise however.  Denies hyper or hypo thyroid symptoms such as voice, skin or hair change.  Most recent tsh over 90 in may 2016.  occas wakes up with HA.  Has nocturia up to 2-3 times per night, but sometimes none depending on what he drinks during theday.  Despite BP today, pt is quite anxious to have furhter antiHTN meds as mult BP at  Home taken daily for several wks have been > 140/90 Past Medical History  Diagnosis Date  . Hyperlipidemia   . Hypertension   . BPH (benign prostatic hypertrophy)   . Hypothyroidism   . Back pain   . Wears glasses   . Peyronie disease   . Allergic rhinitis, cause unspecified 02/14/2014   Past Surgical History  Procedure Laterality Date  . Inguinal hernia repair Right AS TEEN  . Nesbit procedure N/A 09/18/2013    Procedure: 16. PLICATION PROCEDURE;  Surgeon: Garnett FarmMark C Ottelin, MD;  [facility];  Service: Urology;  Laterality: N/A;    reports that he has never smoked. He has never used smokeless tobacco. He reports that he does not drink alcohol or use illicit drugs. family history includes Stroke in his mother; Thyroid disease in his sister. No Known Allergies Current Outpatient Prescriptions on File Prior to Visit  Medication Sig Dispense Refill  . aspirin 81 MG tablet Take 81 mg by mouth  daily.      . cetirizine (ZYRTEC) 10 MG tablet Take 1 tablet (10 mg total) by mouth daily. As needed for allergies 30 tablet 11  . fluticasone (FLONASE) 50 MCG/ACT nasal spray Place 2 sprays into both nostrils daily. 16 g 2  . levothyroxine (SYNTHROID, LEVOTHROID) 100 MCG tablet TAKE 1 TABLET (100 MCG TOTAL) BY MOUTH DAILY BEFORE BREAKFAST. 30 tablet 2  . lisinopril (PRINIVIL,ZESTRIL) 20 MG tablet TAKE 1 TABLET BY MOUTH DAILY. 90 tablet 2  . simvastatin (ZOCOR) 40 MG tablet TAKE 1 TABLET BY MOUTH ATBEDTIME. 90 tablet 0  . tamsulosin (FLOMAX) 0.4 MG CAPS capsule TAKE 1 CAPSULE BY MOUTH DAILY. 90 capsule 1  . triamcinolone cream (KENALOG) 0.1 % Apply 1 application topically 2 (two) times daily. 30 g 0   No current facility-administered medications on file prior to visit.   Review of Systems  Constitutional: Negative for unusual diaphoresis or night sweats HENT: Negative for ringing in ear or discharge Eyes: Negative for double vision or worsening visual disturbance.  Respiratory: Negative for choking and stridor.   Gastrointestinal: Negative for vomiting or other signifcant bowel change Genitourinary: Negative for hematuria or change in urine volume.  Musculoskeletal: Negative for other MSK pain or swelling Skin: Negative for color change and worsening wound.  Neurological: Negative for tremors and numbness other than noted  Psychiatric/Behavioral: Negative for decreased concentration or agitation other than above  '  Objective:   Physical Exam BP 118/74 mmHg  Pulse 74  Temp(Src) 98.9 F (37.2 C) (Oral)  Ht  (1.676 m)  Wt 164 lb (74.39 kg)  BMI 26.48 kg/m2  SpO2 99% VS noted,  Constitutional: Pt appears in no significant distress HENT: Head: NCAT.  Right Ear: External ear normal.  Left Ear: External ear normal.  Eyes: . Pupils are equal, round, and reactive to light. Conjunctivae and EOM are normal Neck: Normal range of motion. Neck supple.  Cardiovascular: Normal rate  and regular rhythm.   Pulmonary/Chest: Effort normal and breath sounds without rales or wheezing.  Abd:  Soft, NT, ND, + BS Neurological: Pt is alert. Not confused , motor grossly intact Skin: Skin is warm. No rash, no LE edema Psychiatric: Pt behavior is normal. No agitation.     Assessment & Plan:

## 2015-04-12 NOTE — Patient Instructions (Signed)
Please take all new medication as prescribed - the amlodipine 5 mg per day  Please continue all other medications as before, and refills have been done if requested.  Please have the pharmacy call with any other refills you may need.  Please continue your efforts at being more active, low cholesterol diet, and weight control.  You are otherwise up to date with prevention measures today.  You will be contacted regarding the referral for: Nerve test for the arms  Please keep your appointments with your specialists as you may have planned  Please go to the LAB in the Basement (turn left off the elevator) for the tests to be done for Monday Nov 7  You will be contacted by phone if any changes need to be made immediately.  Otherwise, you will receive a letter about your results with an explanation, but please check with MyChart first.  Please remember to sign up for MyChart if you have not done so, as this will be important to you in the future with finding out test results, communicating by private email, and scheduling acute appointments online when needed.  Please return in 6 months, or sooner if needed

## 2015-04-15 ENCOUNTER — Other Ambulatory Visit (INDEPENDENT_AMBULATORY_CARE_PROVIDER_SITE_OTHER): Payer: BLUE CROSS/BLUE SHIELD

## 2015-04-15 ENCOUNTER — Other Ambulatory Visit: Payer: Self-pay | Admitting: *Deleted

## 2015-04-15 ENCOUNTER — Telehealth: Payer: Self-pay

## 2015-04-15 DIAGNOSIS — I10 Essential (primary) hypertension: Secondary | ICD-10-CM

## 2015-04-15 DIAGNOSIS — E039 Hypothyroidism, unspecified: Secondary | ICD-10-CM

## 2015-04-15 DIAGNOSIS — R351 Nocturia: Secondary | ICD-10-CM

## 2015-04-15 DIAGNOSIS — E785 Hyperlipidemia, unspecified: Secondary | ICD-10-CM

## 2015-04-15 DIAGNOSIS — M25522 Pain in left elbow: Secondary | ICD-10-CM

## 2015-04-15 LAB — URINALYSIS, ROUTINE W REFLEX MICROSCOPIC
BILIRUBIN URINE: NEGATIVE
HGB URINE DIPSTICK: NEGATIVE
KETONES UR: NEGATIVE
LEUKOCYTES UA: NEGATIVE
NITRITE: NEGATIVE
RBC / HPF: NONE SEEN (ref 0–?)
SPECIFIC GRAVITY, URINE: 1.01 (ref 1.000–1.030)
Total Protein, Urine: NEGATIVE
URINE GLUCOSE: NEGATIVE
UROBILINOGEN UA: 0.2 (ref 0.0–1.0)
pH: 6 (ref 5.0–8.0)

## 2015-04-15 LAB — T4, FREE: Free T4: 1.17 ng/dL (ref 0.60–1.60)

## 2015-04-15 LAB — LIPID PANEL
CHOLESTEROL: 136 mg/dL (ref 0–200)
HDL: 33.7 mg/dL — AB (ref >39.00)
LDL Cholesterol: 68 mg/dL (ref 0–99)
NonHDL: 102.05
TRIGLYCERIDES: 168 mg/dL — AB (ref 0.0–149.0)
Total CHOL/HDL Ratio: 4
VLDL: 33.6 mg/dL (ref 0.0–40.0)

## 2015-04-15 LAB — BASIC METABOLIC PANEL
BUN: 15 mg/dL (ref 6–23)
CHLORIDE: 105 meq/L (ref 96–112)
CO2: 26 meq/L (ref 19–32)
Calcium: 9.7 mg/dL (ref 8.4–10.5)
Creatinine, Ser: 0.99 mg/dL (ref 0.40–1.50)
GFR: 80.79 mL/min (ref >60.00)
GLUCOSE: 84 mg/dL (ref 70–99)
POTASSIUM: 4.1 meq/L (ref 3.5–5.1)
Sodium: 140 mEq/L (ref 135–145)

## 2015-04-15 LAB — TSH: TSH: 0.47 u[IU]/mL (ref 0.35–4.50)

## 2015-04-15 LAB — PSA: PSA: 0.61 ng/mL (ref 0.10–4.00)

## 2015-04-15 NOTE — Telephone Encounter (Signed)
Patient came in for labs this am, but stated he thought dr Jonny Ruizjohn was [facility], Coralee Northina from lab has called to ask if you want A1C added in on patients labs----please advise, i will call nina in lab and advise on Tuesday after Dr Jonny RuizJohn returns to office on Tuesday

## 2015-04-16 ENCOUNTER — Encounter: Payer: Self-pay | Admitting: Internal Medicine

## 2015-04-16 NOTE — Telephone Encounter (Signed)
There is no need for this, as there is no significant hx of elevated blood sugar or DM  Current BS is normal.  A1c is not a routine test, and only done for the above

## 2015-04-17 NOTE — Telephone Encounter (Signed)
Advised lynette/lab of dr Raphael Gibneyjohn's note, she is to let nina/lab know

## 2015-04-19 ENCOUNTER — Telehealth: Payer: Self-pay | Admitting: Internal Medicine

## 2015-04-19 NOTE — Telephone Encounter (Signed)
Pt request lab result that was done 04/16/15.   Phone # [phone]

## 2015-04-30 ENCOUNTER — Encounter: Payer: BLUE CROSS/BLUE SHIELD | Admitting: Neurology

## 2015-07-10 ENCOUNTER — Telehealth: Payer: Self-pay | Admitting: Internal Medicine

## 2015-07-10 NOTE — Telephone Encounter (Signed)
Ok with me 

## 2015-07-10 NOTE — Telephone Encounter (Signed)
Pt request to transfer from Dr. Jonny Ruiz to Dr. Lawerance Bach. Pt want to be seen for a physical. Please advise.

## 2015-07-11 NOTE — Telephone Encounter (Signed)
ok 

## 2015-08-26 ENCOUNTER — Encounter: Payer: Self-pay | Admitting: Adult Health

## 2015-08-26 ENCOUNTER — Ambulatory Visit (INDEPENDENT_AMBULATORY_CARE_PROVIDER_SITE_OTHER): Payer: BLUE CROSS/BLUE SHIELD | Admitting: Adult Health

## 2015-08-26 DIAGNOSIS — R6889 Other general symptoms and signs: Secondary | ICD-10-CM

## 2015-08-26 LAB — POCT INFLUENZA A: Rapid Influenza A Ag: POSITIVE

## 2015-08-26 MED ORDER — ONDANSETRON HCL 4 MG PO TABS: 4.0000 mg | ORAL_TABLET | Freq: Three times a day (TID) | ORAL | Status: DC | PRN

## 2015-08-26 NOTE — Addendum Note (Signed)
Addended by: Janelle FloorHOMPSON, MONICA B on: 08/26/2015 03:06 PM   Modules accepted: Orders

## 2015-08-26 NOTE — Progress Notes (Signed)
Subjective:    Patient ID: Peter Clark, male    DOB: Apr 27, 1951, 65 y.o.   MRN: [id number]  HPI  65 year old male who presents to the office today with GI symptoms.  Started Saturday night with a sore throat On Sunday he started with nausea and vomiting ( 2-3 times). Today with fever up to 101 and body aches.   He denies any diarrhea, nausea or vomiting currently.   Has not been using anything over the counter. He is eating and drinking normally.    Review of Systems  HENT: Positive for rhinorrhea and sore throat. Negative for postnasal drip, sinus pressure and trouble swallowing.   Respiratory: Negative.   Cardiovascular: Negative.   Gastrointestinal: Positive for nausea, vomiting and abdominal pain. Negative for diarrhea, constipation and abdominal distention.  Musculoskeletal: Positive for myalgias.  Neurological: Positive for headaches.   Past Medical History  Diagnosis Date  . Hyperlipidemia   . Hypertension   . BPH (benign prostatic hypertrophy)   . Hypothyroidism   . Back pain   . Wears glasses   . Peyronie disease   . Allergic rhinitis, cause unspecified 02/14/2014    Social History   Social History  . Marital Status: Married    Spouse Name: N/A  . Number of Children: 2  . Years of Education: N/A   Occupational History  . Not on file.   Social History Main Topics  . Smoking status: Never Smoker   . Smokeless tobacco: Never Used  . Alcohol Use: No  . Drug Use: No  . Sexual Activity: Not on file   Other Topics Concern  . Not on file   Social History Narrative    Past Surgical History  Procedure Laterality Date  . Inguinal hernia repair Right AS TEEN  . Nesbit procedure N/A 09/18/2013    Procedure: 16. PLICATION PROCEDURE;  Surgeon: Garnett FarmMark C Ottelin, MD;  [facility] [place];  Service: Urology;  Laterality: N/A;    Family History  Problem Relation Age of Onset  . Stroke Mother   . Thyroid disease Sister     No Known  Allergies  Current Outpatient Prescriptions on File Prior to Visit  Medication Sig Dispense Refill  . amLODipine (NORVASC) 5 MG tablet Take 1 tablet (5 mg total) by mouth daily. 90 tablet 3  . aspirin 81 MG tablet Take 81 mg by mouth daily.      . cetirizine (ZYRTEC) 10 MG tablet Take 1 tablet (10 mg total) by mouth daily. As needed for allergies 30 tablet 11  . fluticasone (FLONASE) 50 MCG/ACT nasal spray Place 2 sprays into both nostrils daily. 16 g 2  . levothyroxine (SYNTHROID, LEVOTHROID) 100 MCG tablet TAKE 1 TABLET (100 MCG TOTAL) BY MOUTH DAILY BEFORE BREAKFAST. 30 tablet 2  . lisinopril (PRINIVIL,ZESTRIL) 20 MG tablet TAKE 1 TABLET BY MOUTH DAILY. 90 tablet 2  . simvastatin (ZOCOR) 40 MG tablet TAKE 1 TABLET BY MOUTH ATBEDTIME. 90 tablet 0  . tamsulosin (FLOMAX) 0.4 MG CAPS capsule TAKE 1 CAPSULE BY MOUTH DAILY. 90 capsule 1  . triamcinolone cream (KENALOG) 0.1 % Apply 1 application topically 2 (two) times daily. 30 g 0   No current facility-administered medications on file prior to visit.    BP 140/88 mmHg  Temp(Src) 99 F (37.2 C) (Oral)  Ht 5\' 6"  (1.676 m)  Wt 160 lb (72.576 kg)  BMI 25.84 kg/m2       Objective:   Physical Exam  Constitutional:  He is oriented to person, place, and time. He appears well-developed and well-nourished. No distress.  Appears tired  HENT:  Head: Normocephalic and atraumatic.  Right Ear: External ear normal.  Left Ear: External ear normal.  Nose: Rhinorrhea (clear) present. Right sinus exhibits no maxillary sinus tenderness and no frontal sinus tenderness. Left sinus exhibits no maxillary sinus tenderness and no frontal sinus tenderness.  Mouth/Throat: Oropharynx is clear and moist. No oropharyngeal exudate.  Eyes: Conjunctivae and EOM are normal. Pupils are equal, round, and reactive to light. Right eye exhibits no discharge. Left eye exhibits no discharge. No scleral icterus.  Neck: Normal range of motion. Neck supple. No thyromegaly  present.  Cardiovascular: Normal rate, regular rhythm, normal heart sounds and intact distal pulses.  Exam reveals no gallop and no friction rub.   No murmur heard. Pulmonary/Chest: Effort normal and breath sounds normal. No respiratory distress. He has no wheezes. He has no rales. He exhibits no tenderness.  Abdominal: Soft. Bowel sounds are normal. He exhibits no distension and no mass. There is no tenderness. There is no rebound and no guarding.  Neurological: He is alert and oriented to person, place, and time.  Skin: Skin is warm and dry. No rash noted. He is not diaphoretic. No erythema. No pallor.  Psychiatric: He has a normal mood and affect. His behavior is normal. Judgment and thought content normal.  Nursing note and vitals reviewed.      Assessment & Plan:  1. Flu-like symptoms - ondansetron (ZOFRAN) 4 MG tablet; Take 1 tablet (4 mg total) by mouth every 8 (eight) hours as needed for nausea or vomiting.  Dispense: 20 tablet; Refill: 0 - POC Influenza A/B-Positive

## 2015-08-26 NOTE — Patient Instructions (Addendum)
It was great meeting you today!  Your flu swab was positive  Your exam is consistent with gastroenteritis or the stomach bug. This is caused by a virus and will pass in a few days. Stay hydrated with with water, juices, tea, gatorade. Eat a bland diet for the next 3 days.   Use Ibuprofen or Tylenol for fever and body aches. If you become nauseated then use the prescribed Zofran  Follow up with Dr. Fayrene Fearing if no improvement.     Influenza, Adult Influenza ("the flu") is a viral infection of the respiratory tract. It occurs more often in winter months because people spend more time in close contact with one another. Influenza can make you feel very sick. Influenza easily spreads from person to person (contagious). CAUSES  Influenza is caused by a virus that infects the respiratory tract. You can catch the virus by breathing in droplets from an infected person's cough or sneeze. You can also catch the virus by touching something that was recently contaminated with the virus and then touching your mouth, nose, or eyes. RISKS AND COMPLICATIONS You may be at risk for a more severe case of influenza if you smoke cigarettes, have diabetes, have chronic heart disease (such as heart failure) or lung disease (such as asthma), or if you have a weakened immune system. Elderly people and pregnant women are also at risk for more serious infections. The most common problem of influenza is a lung infection (pneumonia). Sometimes, this problem can require emergency medical care and may be life threatening. SIGNS AND SYMPTOMS  Symptoms typically last 4 to 10 days and may include:  Fever.  Chills.  Headache, body aches, and muscle aches.  Sore throat.  Chest discomfort and cough.  Poor appetite.  Weakness or feeling tired.  Dizziness.  Nausea or vomiting. DIAGNOSIS  Diagnosis of influenza is often made based on your history and a physical exam. A nose or throat swab test can be done to confirm the  diagnosis. TREATMENT  In mild cases, influenza goes away on its own. Treatment is directed at relieving symptoms. For more severe cases, your health care provider may prescribe antiviral medicines to shorten the sickness. Antibiotic medicines are not effective because the infection is caused by a virus, not by bacteria. HOME CARE INSTRUCTIONS  Take medicines only as directed by your health care provider.  Use a cool mist humidifier to make breathing easier.  Get plenty of rest until your temperature returns to normal. This usually takes 3 to 4 days.  Drink enough fluid to keep your urine clear or pale yellow.  Cover yourmouth and nosewhen coughing or sneezing,and wash your handswellto prevent thevirusfrom spreading.  Stay homefromwork orschool untilthe fever is gonefor at least 40full day. PREVENTION  An annual influenza vaccination (flu shot) is the best way to avoid getting influenza. An annual flu shot is now routinely recommended for all adults in the [place] SEEK MEDICAL CARE IF:  You experiencechest pain, yourcough worsens,or you producemore mucus.  Youhave nausea,vomiting, ordiarrhea.  Your fever returns or gets worse. SEEK IMMEDIATE MEDICAL CARE IF:  You havetrouble breathing, you become short of breath,or your skin ornails becomebluish.  You have severe painor stiffnessin the neck.  You develop a sudden headache, or pain in the face or ear.  You have nausea or vomiting that you cannot control. MAKE SURE YOU:   Understand these instructions.  Will watch your condition.  Will get help right away if you are not doing well or  get worse.   This information is not intended to replace advice given to you by your health care provider. Make sure you discuss any questions you have with your health care provider.   Document Released: 05/22/2000 Document Revised: 06/15/2014 Document Reviewed: 08/24/2011 Elsevier Interactive Patient Education [email or web address]  [employer]

## 2015-08-27 ENCOUNTER — Telehealth: Payer: Self-pay | Admitting: Internal Medicine

## 2015-08-27 NOTE — Telephone Encounter (Signed)
TELEPHONE ADVICE RECORD [facility]  Patient Name: Peter Clark  DOB: February 14, 1951    Initial Comment Caller states husband has a fever of 101 today. Seen yesterday. Dx with Flu. Dr. gave him something for nausea but nothing for fever.   Nurse Assessment  Nurse: Odis LusterBowers, RN, Bjorn Loserhonda Date/Time Lamount Cohen(Eastern Time): 08/27/2015 4:11:12 PM  Confirm and document reason for call. If symptomatic, describe symptoms. You must click the next button to save text entered. ---Caller states husband has a fever of 101 today. Seen yesterday. Dx with Flu. Dr. gave him something for nausea but nothing for fever. Reports that the patient is taking tylenol and advil.  Has the patient traveled out of the country within the last 30 days? ---Not Applicable  Does the patient have any new or worsening symptoms? ---Yes  Will a triage be completed? ---Yes  Related visit to physician within the last 2 weeks? ---Yes  Does the PT have any chronic conditions? (i.e. diabetes, asthma, etc.) ---Yes  List chronic conditions. ---high chol, HTN,  Is this a behavioral health or substance abuse call? ---No     Guidelines    Guideline Title Affirmed Question Affirmed Notes  Influenza Follow-up Call [1] Influenza (diagnosed by HCP) AND [2] no complications (all triage questions negative)    Final Disposition User   Home Care Odis LusterBowers, RN, Bjorn LoserRhonda    Comments  Caller speaks very broken English, further care advice would not have been understood effectively.   Disagree/Comply: Comply

## 2015-09-02 ENCOUNTER — Encounter: Payer: Self-pay | Admitting: Internal Medicine

## 2015-09-02 ENCOUNTER — Ambulatory Visit (INDEPENDENT_AMBULATORY_CARE_PROVIDER_SITE_OTHER): Payer: BLUE CROSS/BLUE SHIELD | Admitting: Internal Medicine

## 2015-09-02 VITALS — BP 120/74 | HR 72 | Temp 98.3°F | Resp 16 | Wt 159.0 lb

## 2015-09-02 DIAGNOSIS — E785 Hyperlipidemia, unspecified: Secondary | ICD-10-CM

## 2015-09-02 DIAGNOSIS — I10 Essential (primary) hypertension: Secondary | ICD-10-CM

## 2015-09-02 DIAGNOSIS — E039 Hypothyroidism, unspecified: Secondary | ICD-10-CM

## 2015-09-02 DIAGNOSIS — R079 Chest pain, unspecified: Secondary | ICD-10-CM | POA: Diagnosis not present

## 2015-09-02 DIAGNOSIS — Z0000 Encounter for general adult medical examination without abnormal findings: Secondary | ICD-10-CM | POA: Diagnosis not present

## 2015-09-02 DIAGNOSIS — N400 Enlarged prostate without lower urinary tract symptoms: Secondary | ICD-10-CM

## 2015-09-02 MED ORDER — AMLODIPINE BESYLATE 5 MG PO TABS: 2.5000 mg | ORAL_TABLET | Freq: Every day | ORAL | Status: AC

## 2015-09-02 NOTE — Assessment & Plan Note (Signed)
Taking simvastatin 40 mg daily Check lipid panel

## 2015-09-02 NOTE — Patient Instructions (Addendum)
  Test(s) ordered today. Your results will be released to MyChart (or called to you) after review, usually within 72hours after test completion. If any changes need to be made, you will be notified at that same time.  All other Health Maintenance issues reviewed.   All recommended immunizations and age-appropriate screenings are up-to-date or discussed.  No immunizations administered today.   An EKG was done today.   Medications reviewed and updated.  Changes include starting 2.5 of amlodipine in addition to the lisinopril.    Your prescription(s) have been submitted to your pharmacy. Please take as directed and contact our office if you believe you are having problem(s) with the medication(s).   Please followup in  6 months, sooner if your blood pressure is not controlled

## 2015-09-02 NOTE — Assessment & Plan Note (Signed)
Is symptomatic and feels it is related to his thyroid Will check tsh, ft4 Will adjust medication if needed Would like to see endocrine again - will refer back to Dr. Everardo AllEllison

## 2015-09-02 NOTE — Progress Notes (Signed)
Pre visit review using our clinic review tool, if applicable. No additional management support is needed unless otherwise documented below in the visit note. 

## 2015-09-02 NOTE — Assessment & Plan Note (Signed)
No relief with flomax psa normal Will refer to urology for further management

## 2015-09-02 NOTE — Assessment & Plan Note (Addendum)
bp well controlled here today, but variable at home - elevated at times Continue lisinopril 20 mg daily Start amlodipine 2.5 mg at night Not compliant with low sodium diet - stressed importance of low sodium diet Continue to monitor BP at home Check cmp

## 2015-09-02 NOTE — Progress Notes (Signed)
Subjective:    Patient ID: Peter Clark, male    DOB: 1951/01/31, 65 y.o.   MRN: [id number]  HPI He is here to establish with a new pcp.   He is here for follow up.   Hypothyroidism:  He is taking his medication daily.  He feels tired and sometimes dizzy.  He does not feel like his medication is right.  He has seen endocrine in the past and would like to see him again.   Flu  He was diagnosed with the flu recently and still has some residual symptoms. He feels his symptoms are improving.    Headache:  He has frequent headaches.  The head pain is usually in the frontal region or on the left side of his pain.  It is often sharp for a few seconds and then dull. She has not found a pattern. He has not taken anything for the headaches.  He does not have a headache now.  He has not had his eyes checked recently.   Hypertension: He is taking his medication daily. He is compliant with a low sodium diet.  He denies chest pain, palpitations, edema, shortness of breath and regular headaches. He is exercising regularly.  He does monitor his blood pressure at home - sometimes it is very high, sometimes it is low.  150-160/90, 110/60.      Medications and allergies reviewed with patient and updated if appropriate.  Patient Active Problem List   Diagnosis Date Noted  . Nocturia 04/12/2015  . Left elbow pain 04/12/2015  . Hypothyroidism 10/11/2014  . Allergic rhinitis, cause unspecified 02/14/2014  . BPH (benign prostatic hyperplasia) 04/01/2012  . Preventative health care 03/26/2011  . Hyperlipidemia 02/06/2010  . Essential hypertension 02/06/2010  . PEYRONIE'S DISEASE 02/06/2010    Current Outpatient Prescriptions on File Prior to Visit  Medication Sig Dispense Refill  . aspirin 81 MG tablet Take 81 mg by mouth daily.      Marland Kitchen. levothyroxine (SYNTHROID, LEVOTHROID) 100 MCG tablet TAKE 1 TABLET (100 MCG TOTAL) BY MOUTH DAILY BEFORE BREAKFAST. 30 tablet 2  . lisinopril (PRINIVIL,ZESTRIL)  20 MG tablet TAKE 1 TABLET BY MOUTH DAILY. 90 tablet 2  . simvastatin (ZOCOR) 40 MG tablet TAKE 1 TABLET BY MOUTH ATBEDTIME. 90 tablet 0  . tamsulosin (FLOMAX) 0.4 MG CAPS capsule TAKE 1 CAPSULE BY MOUTH DAILY. 90 capsule 1   No current facility-administered medications on file prior to visit.    Past Medical History  Diagnosis Date  . Hyperlipidemia   . Hypertension   . BPH (benign prostatic hypertrophy)   . Hypothyroidism   . Back pain   . Wears glasses   . Peyronie disease   . Allergic rhinitis, cause unspecified 02/14/2014    Past Surgical History  Procedure Laterality Date  . Inguinal hernia repair Right AS TEEN  . Nesbit procedure N/A 09/18/2013    Procedure: 16. PLICATION PROCEDURE;  Surgeon: Garnett FarmMark C Ottelin, MD;  [facility] [place];  Service: Urology;  Laterality: N/A;    Social History   Social History  . Marital Status: Married    Spouse Name: N/A  . Number of Children: 2  . Years of Education: N/A   Social History Main Topics  . Smoking status: Never Smoker   . Smokeless tobacco: Never Used  . Alcohol Use: No  . Drug Use: No  . Sexual Activity: Not on file   Other Topics Concern  . Not on file   Social  History Narrative    Family History  Problem Relation Age of Onset  . Stroke Mother   . Thyroid disease Sister     Review of Systems  Constitutional: Negative for fever.  Eyes: Negative for visual disturbance.  Respiratory: Positive for cough (from flu) and shortness of breath (sometimes with stairs). Negative for wheezing.   Cardiovascular: Positive for chest pain (occasional, at rest). Negative for palpitations and leg swelling.  Gastrointestinal: Negative for nausea, abdominal pain, diarrhea, constipation and blood in stool.  Genitourinary: Positive for difficulty urinating. Negative for dysuria, frequency and hematuria.  Musculoskeletal: Negative for myalgias, back pain and arthralgias.  Neurological: Positive for headaches.  Negative for dizziness and light-headedness.       Objective:   Filed Vitals:   09/02/15 1540  BP: 120/74  Pulse: 72  Temp: 98.3 F (36.8 C)  Resp: 16   Filed Weights   09/02/15 1540  Weight: 159 lb (72.122 kg)   Body mass index is 25.68 kg/(m^2).   Physical Exam Constitutional: He appears well-developed and well-nourished. No distress.  HENT:  Head: Normocephalic and atraumatic.  Right Ear: External ear normal.  Left Ear: External ear normal.  Mouth/Throat: Oropharynx is clear and moist.  Normal ear canals and TM b/l  Eyes: Conjunctivae and EOM are normal.  Neck: Neck supple. No tracheal deviation present. No thyromegaly present.  No carotid bruit  Cardiovascular: Normal rate, regular rhythm, normal heart sounds and intact distal pulses.   No murmur heard. Pulmonary/Chest: Effort normal and breath sounds normal. No respiratory distress. He has no wheezes. He has no rales.  Abdominal: Soft. Bowel sounds are normal. He exhibits no distension. There is no tenderness.  Genitourinary:  deferred  Musculoskeletal: He exhibits no edema.  Lymphadenopathy:    He has no cervical adenopathy.  Skin: Skin is warm and dry. He is not diaphoretic.  Psychiatric: He has a normal mood and affect. His behavior is normal.       Assessment & Plan:   Physical exam: Screening blood work ordered Immunizations Up to date  Colonoscopy Up to date  Eye exams - will schedule EKG  -  Done today, normal Exercise - exercises regularly Weight - normal, continue regular exercise Substance abuse - no concerns   Chest pain EKG today - normal Chest pain is very atypical of cardiac pain - it is transient pain that occurs at rest, no pain with activity - he does exercise regularly  See Problem List for Assessment and Plan of chronic medical problems.

## 2015-09-03 ENCOUNTER — Other Ambulatory Visit (INDEPENDENT_AMBULATORY_CARE_PROVIDER_SITE_OTHER): Payer: BLUE CROSS/BLUE SHIELD

## 2015-09-03 DIAGNOSIS — Z0000 Encounter for general adult medical examination without abnormal findings: Secondary | ICD-10-CM

## 2015-09-03 DIAGNOSIS — I10 Essential (primary) hypertension: Secondary | ICD-10-CM

## 2015-09-03 DIAGNOSIS — E039 Hypothyroidism, unspecified: Secondary | ICD-10-CM

## 2015-09-03 DIAGNOSIS — E785 Hyperlipidemia, unspecified: Secondary | ICD-10-CM

## 2015-09-03 LAB — LIPID PANEL
CHOL/HDL RATIO: 5
CHOLESTEROL: 138 mg/dL (ref 0–200)
HDL: 30.5 mg/dL — AB (ref >39.00)
NonHDL: 107.86
Triglycerides: 240 mg/dL — ABNORMAL HIGH (ref 0.0–149.0)
VLDL: 48 mg/dL — AB (ref 0.0–40.0)

## 2015-09-03 LAB — CBC WITH DIFFERENTIAL/PLATELET
BASOS PCT: 0.3 % (ref 0.0–3.0)
Basophils Absolute: 0 10*3/uL (ref 0.0–0.1)
EOS PCT: 2 % (ref 0.0–5.0)
Eosinophils Absolute: 0.1 10*3/uL (ref 0.0–0.7)
HCT: 42.2 % (ref 39.0–52.0)
HEMOGLOBIN: 14.5 g/dL (ref 13.0–17.0)
LYMPHS ABS: 2.3 10*3/uL (ref 0.7–4.0)
Lymphocytes Relative: 32.3 % (ref 12.0–46.0)
MCHC: 34.4 g/dL (ref 30.0–36.0)
MCV: 89.2 fl (ref 78.0–100.0)
MONOS PCT: 8.8 % (ref 3.0–12.0)
Monocytes Absolute: 0.6 10*3/uL (ref 0.1–1.0)
Neutro Abs: 4.1 10*3/uL (ref 1.4–7.7)
Neutrophils Relative %: 56.6 % (ref 43.0–77.0)
Platelets: 160 10*3/uL (ref 150.0–400.0)
RBC: 4.73 Mil/uL (ref 4.22–5.81)
RDW: 13.8 % (ref 11.5–15.5)
WBC: 7.3 10*3/uL (ref 4.0–10.5)

## 2015-09-03 LAB — COMPREHENSIVE METABOLIC PANEL
ALT: 27 U/L (ref 0–53)
AST: 22 U/L (ref 0–37)
Albumin: 4.3 g/dL (ref 3.5–5.2)
Alkaline Phosphatase: 63 U/L (ref 39–117)
BUN: 14 mg/dL (ref 6–23)
CHLORIDE: 107 meq/L (ref 96–112)
CO2: 28 meq/L (ref 19–32)
Calcium: 9.4 mg/dL (ref 8.4–10.5)
Creatinine, Ser: 1.24 mg/dL (ref 0.40–1.50)
GFR: 62.23 mL/min (ref >60.00)
GLUCOSE: 102 mg/dL — AB (ref 70–99)
POTASSIUM: 3.8 meq/L (ref 3.5–5.1)
SODIUM: 141 meq/L (ref 135–145)
Total Bilirubin: 0.4 mg/dL (ref 0.2–1.2)
Total Protein: 6.8 g/dL (ref 6.0–8.3)

## 2015-09-03 LAB — LDL CHOLESTEROL, DIRECT: Direct LDL: 80 mg/dL

## 2015-09-03 LAB — HEMOGLOBIN A1C: HEMOGLOBIN A1C: 5.9 % (ref 4.6–6.5)

## 2015-09-03 LAB — TSH: TSH: 4.58 u[IU]/mL — AB (ref 0.35–4.50)

## 2015-09-03 LAB — T4, FREE: FREE T4: 1.02 ng/dL (ref 0.60–1.60)

## 2015-09-03 LAB — HEPATITIS C ANTIBODY: HCV AB: NEGATIVE

## 2015-09-08 ENCOUNTER — Encounter: Payer: Self-pay | Admitting: Internal Medicine

## 2015-09-08 DIAGNOSIS — R7303 Prediabetes: Secondary | ICD-10-CM | POA: Insufficient documentation

## 2015-09-11 ENCOUNTER — Encounter: Payer: Self-pay | Admitting: Emergency Medicine

## 2015-09-11 ENCOUNTER — Telehealth: Payer: Self-pay | Admitting: Internal Medicine

## 2015-09-11 NOTE — Telephone Encounter (Signed)
Is requesting call back with lab results.  Patients wife has appointment today would like to give labs to her.  Wife name is Jamalieh Tajeddini.  Has appointment with Marcos EkeGreg Calone at 2:30pm.

## 2015-09-11 NOTE — Telephone Encounter (Signed)
Lab results faxed and given to pts wife

## 2015-09-30 ENCOUNTER — Ambulatory Visit: Payer: BLUE CROSS/BLUE SHIELD | Admitting: Endocrinology

## 2015-10-04 ENCOUNTER — Ambulatory Visit (INDEPENDENT_AMBULATORY_CARE_PROVIDER_SITE_OTHER): Payer: BLUE CROSS/BLUE SHIELD | Admitting: Endocrinology

## 2015-10-04 ENCOUNTER — Encounter: Payer: Self-pay | Admitting: Endocrinology

## 2015-10-04 VITALS — BP 128/78 | HR 83 | Temp 97.8°F | Ht 66.0 in | Wt 163.0 lb

## 2015-10-04 DIAGNOSIS — E039 Hypothyroidism, unspecified: Secondary | ICD-10-CM

## 2015-10-04 LAB — TSH: TSH: 2.48 u[IU]/mL (ref 0.35–4.50)

## 2015-10-04 MED ORDER — LEVOTHYROXINE SODIUM 100 MCG PO TABS: ORAL_TABLET | ORAL | Status: DC

## 2015-10-04 MED ORDER — LEVOTHYROXINE SODIUM 100 MCG PO TABS: 100.0000 ug | ORAL_TABLET | Freq: Every day | ORAL | Status: DC

## 2015-10-04 NOTE — Progress Notes (Signed)
   Subjective:    Patient ID: Peter Clark, male    DOB: 1951-02-26, 65 y.o.   MRN: [id number]  HPI Pt returns for f/u of chronic primary hypothyroidism (dx'ed 2008; he has been on prescribed thyroid hormone therapy since then; he has never had thyroid imaging; since 2016, synthroid requirement has varied widely, but he has been on his current 100 mcg/d since mid-2016).   Past Medical History  Diagnosis Date  . Hyperlipidemia   . Hypertension   . BPH (benign prostatic hypertrophy)   . Hypothyroidism   . Back pain   . Wears glasses   . Peyronie disease   . Allergic rhinitis, cause unspecified 02/14/2014    Past Surgical History  Procedure Laterality Date  . Inguinal hernia repair Right AS TEEN  . Nesbit procedure N/A 09/18/2013    Procedure: 16. PLICATION PROCEDURE;  Surgeon: Garnett FarmMark C Ottelin, MD;  [facility] [place];  Service: Urology;  Laterality: N/A;    Social History   Social History  . Marital Status: Married    Spouse Name: N/A  . Number of Children: 2  . Years of Education: N/A   Occupational History  . Not on file.   Social History Main Topics  . Smoking status: Never Smoker   . Smokeless tobacco: Never Used  . Alcohol Use: No  . Drug Use: No  . Sexual Activity: Not on file   Other Topics Concern  . Not on file   Social History Narrative    Current Outpatient Prescriptions on File Prior to Visit  Medication Sig Dispense Refill  . amLODipine (NORVASC) 5 MG tablet Take 0.5 tablets (2.5 mg total) by mouth daily. 90 tablet 3  . aspirin 81 MG tablet Take 81 mg by mouth daily.      Marland Kitchen. lisinopril (PRINIVIL,ZESTRIL) 20 MG tablet TAKE 1 TABLET BY MOUTH DAILY. 90 tablet 2  . simvastatin (ZOCOR) 40 MG tablet TAKE 1 TABLET BY MOUTH ATBEDTIME. 90 tablet 0  . tamsulosin (FLOMAX) 0.4 MG CAPS capsule TAKE 1 CAPSULE BY MOUTH DAILY. 90 capsule 1   No current facility-administered medications on file prior to visit.    No Known Allergies  Family  History  Problem Relation Age of Onset  . Stroke Mother   . Thyroid disease Sister     BP 128/78 mmHg  Pulse 83  Temp(Src) 97.8 F (36.6 C) (Oral)  Ht 5\' 6"  (1.676 m)  Wt 163 lb (73.936 kg)  BMI 26.32 kg/m2  SpO2 97%  Review of Systems No weight change    Objective:   Physical Exam VITAL SIGNS:  See vs page GENERAL: no distress NECK: There is no palpable thyroid enlargement.  No thyroid nodule is palpable.  No palpable lymphadenopathy at the anterior neck.   Lab Results  Component Value Date   TSH 2.48 10/04/2015      Assessment & Plan:  Hypothyroidism: well-replaced.   Patient is advised the following: Patient Instructions  blood tests are requested for you today.  We'll let you know about the results. I would be happy to see you back here as needed.    addendum: continue the same synthroid.

## 2015-10-04 NOTE — Patient Instructions (Addendum)
blood tests are requested for you today.  We'll let you know about the results. I would be happy to see you back here as needed.   

## 2015-10-07 ENCOUNTER — Telehealth: Payer: Self-pay | Admitting: Endocrinology

## 2015-10-07 ENCOUNTER — Other Ambulatory Visit: Payer: Self-pay | Admitting: Internal Medicine

## 2015-10-07 NOTE — Telephone Encounter (Signed)
lmovm to return call  °

## 2015-10-07 NOTE — Telephone Encounter (Signed)
PT returning your phone call 

## 2015-10-07 NOTE — Telephone Encounter (Signed)
LMOVM to return call regarding lab results/

## 2015-10-07 NOTE — Telephone Encounter (Signed)
Pt calling for results of the labs from last week and is asking for meds to be called in or what needs to be done

## 2015-10-08 ENCOUNTER — Telehealth: Payer: Self-pay | Admitting: Endocrinology

## 2015-10-08 NOTE — Telephone Encounter (Signed)
Requested a call back from the pt to discuss.  

## 2015-10-08 NOTE — Telephone Encounter (Signed)
Patient is returning your call.  

## 2015-10-10 ENCOUNTER — Telehealth: Payer: Self-pay | Admitting: Endocrinology

## 2015-10-10 NOTE — Telephone Encounter (Signed)
Patient medication

## 2015-10-25 ENCOUNTER — Telehealth: Payer: Self-pay | Admitting: Endocrinology

## 2015-10-25 NOTE — Telephone Encounter (Addendum)
Patient wife would like to know if her husband and can take these supplements with his thyroid medication, Niacin, iodine Selenium. please advise

## 2015-10-25 NOTE — Telephone Encounter (Signed)
Please avoid the iodine, as it is not good for the thyroid The others are fine

## 2015-10-25 NOTE — Telephone Encounter (Signed)
Pt's wife advised via voicemail of note below. Requested a call back she would like to discuss.

## 2015-10-25 NOTE — Telephone Encounter (Signed)
See note below and please advise, Thanks! 

## 2015-11-11 ENCOUNTER — Other Ambulatory Visit: Payer: Self-pay | Admitting: Internal Medicine

## 2015-12-23 ENCOUNTER — Other Ambulatory Visit: Payer: Self-pay | Admitting: Internal Medicine

## 2015-12-23 MED ORDER — LEVOTHYROXINE SODIUM 100 MCG PO TABS: 100.0000 ug | ORAL_TABLET | Freq: Every day | ORAL | Status: AC

## 2016-03-19 ENCOUNTER — Other Ambulatory Visit: Payer: Self-pay | Admitting: *Deleted

## 2016-03-19 MED ORDER — LISINOPRIL 20 MG PO TABS: 20.0000 mg | ORAL_TABLET | Freq: Every day | ORAL | 1 refills | Status: AC

## 2016-03-19 NOTE — Telephone Encounter (Signed)
Wife called requesting refill on Lisinopril to be sent to [facility] in [place]. Notified wife sending electronically...Raechel Chute/lmb

## 2016-03-26 ENCOUNTER — Ambulatory Visit (INDEPENDENT_AMBULATORY_CARE_PROVIDER_SITE_OTHER): Payer: BLUE CROSS/BLUE SHIELD | Admitting: Family Medicine

## 2016-03-26 ENCOUNTER — Encounter: Payer: Self-pay | Admitting: Family Medicine

## 2016-03-26 VITALS — BP 110/78 | HR 84 | Wt 155.6 lb

## 2016-03-26 DIAGNOSIS — R300 Dysuria: Secondary | ICD-10-CM

## 2016-03-26 DIAGNOSIS — N3090 Cystitis, unspecified without hematuria: Secondary | ICD-10-CM | POA: Diagnosis not present

## 2016-03-26 LAB — POCT URINALYSIS DIPSTICK
BILIRUBIN UA: NEGATIVE
GLUCOSE UA: NEGATIVE
Ketones, UA: NEGATIVE
Protein, UA: NEGATIVE
RBC UA: NEGATIVE
Urobilinogen, UA: 0.2
pH, UA: 6

## 2016-03-26 MED ORDER — SULFAMETHOXAZOLE-TRIMETHOPRIM 800-160 MG PO TABS: 1.0000 | ORAL_TABLET | Freq: Two times a day (BID) | ORAL | 0 refills | Status: DC

## 2016-03-26 MED ORDER — SULFAMETHOXAZOLE-TRIMETHOPRIM 800-160 MG PO TABS
1.0000 | ORAL_TABLET | Freq: Two times a day (BID) | ORAL | 0 refills | Status: AC
Start: 2016-03-26 — End: ?

## 2016-03-26 NOTE — Patient Instructions (Signed)
Please take antibiotic as directed. If you have any problems taking this antibiotic, please contact the office and another choice will be offered. Please follow up if symptoms do not improve, worsen, or you develop a fever, chills, blood in your urine, or new symptoms.   Urinary Tract Infection Urinary tract infections (UTIs) can develop anywhere along your urinary tract. Your urinary tract is your body's drainage system for removing wastes and extra water. Your urinary tract includes two kidneys, two ureters, a bladder, and a urethra. Your kidneys are a pair of bean-shaped organs. Each kidney is about the size of your fist. They are located below your ribs, one on each side of your spine. CAUSES Infections are caused by microbes, which are microscopic organisms, including fungi, viruses, and bacteria. These organisms are so small that they can only be seen through a microscope. Bacteria are the microbes that most commonly cause UTIs. SYMPTOMS  Symptoms of UTIs may vary by age and gender of the patient and by the location of the infection. Symptoms in young women typically include a frequent and intense urge to urinate and a painful, burning feeling in the bladder or urethra during urination. Older women and men are more likely to be tired, shaky, and weak and have muscle aches and abdominal pain. A fever may mean the infection is in your kidneys. Other symptoms of a kidney infection include pain in your back or sides below the ribs, nausea, and vomiting. DIAGNOSIS To diagnose a UTI, your caregiver will ask you about your symptoms. Your caregiver will also ask you to provide a urine sample. The urine sample will be tested for bacteria and white blood cells. White blood cells are made by your body to help fight infection. TREATMENT  Typically, UTIs can be treated with medication. Because most UTIs are caused by a bacterial infection, they usually can be treated with the use of antibiotics. The choice of  antibiotic and length of treatment depend on your symptoms and the type of bacteria causing your infection. HOME CARE INSTRUCTIONS  If you were prescribed antibiotics, take them exactly as your caregiver instructs you. Finish the medication even if you feel better after you have only taken some of the medication.  Drink enough water and fluids to keep your urine clear or pale yellow.  Avoid caffeine, tea, and carbonated beverages. They tend to irritate your bladder.  Empty your bladder often. Avoid holding urine for long periods of time.  Empty your bladder before and after sexual intercourse.  After a bowel movement, women should cleanse from front to back. Use each tissue only once. SEEK MEDICAL CARE IF:   You have back pain.  You develop a fever.  Your symptoms do not begin to resolve within 3 days. SEEK IMMEDIATE MEDICAL CARE IF:   You have severe back pain or lower abdominal pain.  You develop chills.  You have nausea or vomiting.  You have continued burning or discomfort with urination. MAKE SURE YOU:   Understand these instructions.  Will watch your condition.  Will get help right away if you are not doing well or get worse.   This information is not intended to replace advice given to you by your health care provider. Make sure you discuss any questions you have with your health care provider.   Document Released: 03/04/2005 Document Revised: 02/13/2015 Document Reviewed: 07/03/2011 Elsevier Interactive Patient Education [employer]2016 [employer]vier Inc.

## 2016-03-26 NOTE — Progress Notes (Signed)
Subjective:    Patient ID: Peter Clark, male    DOB: 02-Sep-1950, 65 y.o.   MRN: [id number]  HPI  Mr. Nix is a 65 year old male who presents today with urinary burning that started one day ago. He reports that this occurred after working in the yard.  Associated symptoms of urinary urgency and frequency.  Denies fever, chills, sweats, hematuria, flank pain, urinary retention, perineal pain or pelvic pain. No treatment at home with the exception of increasing water intake. No alleviating or aggravating factors have been identified at this time. History of BPH; Normal psa noted from preventive visit in 08/2015. His PCP has referred him to urology due to history of BPH. He is currently in the middle of a move to [place] and has established care with a provder.  Review of Systems  Constitutional: Negative for chills, fatigue and fever.  Respiratory: Negative for cough, shortness of breath and wheezing.   Cardiovascular: Negative for chest pain and palpitations.  Gastrointestinal: Negative for abdominal pain, diarrhea, nausea and vomiting.  Genitourinary: Positive for dysuria, frequency and urgency. Negative for hematuria and testicular pain.  Musculoskeletal: Negative for myalgias.  Skin: Negative for rash.   Past Medical History:  Diagnosis Date  . Allergic rhinitis, cause unspecified 02/14/2014  . Back pain   . BPH (benign prostatic hypertrophy)   . Hyperlipidemia   . Hypertension   . Hypothyroidism   . Peyronie disease   . Wears glasses      Social History   Social History  . Marital status: Married    Spouse name: N/A  . Number of children: 2  . Years of education: N/A   Occupational History  . Not on file.   Social History Main Topics  . Smoking status: Never Smoker  . Smokeless tobacco: Never Used  . Alcohol use No  . Drug use: No  . Sexual activity: Not on file   Other Topics Concern  . Not on file   Social History Narrative  . No narrative on  file    Past Surgical History:  Procedure Laterality Date  . INGUINAL HERNIA REPAIR Right AS TEEN  . NESBIT PROCEDURE N/A 09/18/2013   Procedure: 16. PLICATION PROCEDURE;  Surgeon: Garnett Farm, MD;  Location: [facility];  Service: Urology;  Laterality: N/A;    Family History  Problem Relation Age of Onset  . Stroke Mother   . Thyroid disease Sister     No Known Allergies  Current Outpatient Prescriptions on File Prior to Visit  Medication Sig Dispense Refill  . amLODipine (NORVASC) 5 MG tablet Take 0.5 tablets (2.5 mg total) by mouth daily. 90 tablet 3  . aspirin 81 MG tablet Take 81 mg by mouth daily.      Marland Kitchen levothyroxine (SYNTHROID, LEVOTHROID) 100 MCG tablet Take 1 tablet (100 mcg total) by mouth daily before breakfast. 90 tablet 1  . lisinopril (PRINIVIL,ZESTRIL) 20 MG tablet Take 1 tablet (20 mg total) by mouth daily. 90 tablet 1  . simvastatin (ZOCOR) 40 MG tablet TAKE 1 TABLET BY MOUTH ATBEDTIME. 90 tablet 3  . tamsulosin (FLOMAX) 0.4 MG CAPS capsule TAKE 1 CAPSULE BY MOUTH DAILY. 90 capsule 1   No current facility-administered medications on file prior to visit.     BP 110/78 (BP Location: Left Arm, Patient Position: Sitting, Cuff Size: Normal)   Pulse 84   Wt 155 lb 9.6 oz (70.6 kg)   SpO2 98%   BMI 25.11 kg/m  Objective:   Physical Exam  Constitutional: He is oriented to person, place, and time. He appears well-developed and well-nourished.  Eyes: Pupils are equal, round, and reactive to light. No scleral icterus.  Neck: Neck supple.  Cardiovascular: Normal rate and regular rhythm.   Pulmonary/Chest: Effort normal and breath sounds normal. He has no wheezes. He has no rales.  Abdominal: Soft. Bowel sounds are normal. There is no tenderness. There is no CVA tenderness.  No suprapubic tenderness present  Genitourinary:  Genitourinary Comments: Declined DRE; Exam deferred  Lymphadenopathy:    He has no cervical adenopathy.    Neurological: He is alert and oriented to person, place, and time. Coordination normal.  Skin: Skin is warm and dry. No rash noted.      Assessment & Plan:  1. Cystitis UA +1 Leukocytes with symptoms support treatment for cystitis. We discussed close follow up with his PCP and urology as he has been referred for history of BPH. He deferred DRE today; exam is reassuring; advised follow up if symptoms do not improve, worsen, or he develops new symptoms. - sulfamethoxazole-trimethoprim (BACTRIM DS,SEPTRA DS) 800-160 MG tablet; Take 1 tablet by mouth 2 (two) times daily.  Dispense: 14 tablet; Refill: 0  2. Dysuria  - POCT urinalysis dipstick  Strongly advised follow up if symptoms do not improve, worsen, or he develops urinary retention or dribbling of urine. Further advised follow up with urology that was recommended by his PCP. He voiced understanding and agreed with plan.  Roddie McJulia Karl Knarr, FNP-C

## 2016-03-26 NOTE — Progress Notes (Signed)
Pre visit review using our clinic review tool, if applicable. No additional management support is needed unless otherwise documented below in the visit note. 

## 2016-03-27 ENCOUNTER — Telehealth: Payer: Self-pay

## 2016-03-27 ENCOUNTER — Telehealth: Payer: Self-pay | Admitting: Internal Medicine

## 2016-03-27 NOTE — Telephone Encounter (Signed)
PLEASE NOTE: All timestamps contained within this report are represented as [place] Standard Time. CONFIDENTIALTY NOTICE: This fax transmission is intended only for the addressee. It contains information that is legally privileged, confidential or otherwise protected from use or disclosure. If you are not the intended recipient, you are strictly prohibited from reviewing, disclosing, copying using or disseminating any of this information or taking any action in reliance on or regarding this information. If you have received this fax in error, please notify us immediately by telephone so that we can arrange for its return to us. Phone: [phone], Toll-Free: [phone], Fax: [phone] Page: 1 of 3 Call Id: [id number] [facility] - Client >>>Contains Verbal Order - Signature Required<<< TELEPHONE ADVICE RECORD [facility] Patient Name: Peter DockerHAMID Clark OUR Gender: Male DOB: 1950-12-22 Age: [age] Y 4 M 3 D Return Phone Number: [phone] (Primary) Address: City/State/Zip: [place] Client [facility] - [facility] - Night Physician AA - PHYSICIAN, Crissie FiguresUNKNOWN- MD Contact Type Call Who Is Calling Patient / Member / Family / Caregiver Call Type Triage / Clinical Relationship To Patient Self Return Phone Number [phone] (Primary) Chief Complaint Pain - Generalized Reason for Call Symptomatic / Request for Health Information Initial Comment Caller states saw Dr today, Dr prescribed prescription for pain, pharmacy wasn't able to fill and was told to call the Dr to get clarification. Translation No Nurse Assessment Nurse: Nicholaus BloomKelley, RN, Myrene BuddyYvonne Date/Time (Eastern Time): 03/26/2016 8:50:30 PM Confirm and document reason for call. If symptomatic, describe symptoms. You must click the next button to save text entered. ---Caller states saw Dr today, Dr prescribed prescription  for infection, pharmacy wasn't able to fill it w/o some clarification and b/c it is now later, he could not reach the dr & the pharmacy is closed. Has the patient traveled out of the country within the last 30 days? ---No Does the patient have any new or worsening symptoms? ---No Please document clinical information provided and list any resource used. ---On call dr notified & Dr Oliver BarreJames John gave verbal order from this pt's chart to order Septra DS(800/160 mg) take 1 tablet by mouth bid for 7 days. The caller [facility] @ [phone] Guidelines Guideline Title Affirmed Question Affirmed Notes Nurse Date/Time Lamount Cohen(Eastern Time) Disp. Time Lamount Cohen(Eastern Time) Disposition Final User 03/26/2016 9:02:16 PM Called On-Call Provider Nicholaus BloomKelley, RN, Myrene BuddyYvonne 03/26/2016 9:16:24 PM Pharmacy Call Nicholaus BloomKelley, RN, Myrene BuddyYvonne Reason: Dr Oliver BarreJames John gave verbal order from this pt's chart to order Septra DS(800/160 mg) take 1 tablet by mouth bid for 7 days, no ref[facility] Pharmacist "Romeo AppleBen' @ [phone] 03/26/2016 9:16:30 PM Clinical Call Yes Nicholaus BloomKelley, RN, Myrene BuddyYvonne PLEASE NOTE: All timestamps contained within this report are represented as [place] Standard Time. CONFIDENTIALTY NOTICE: This fax transmission is intended only for the addressee. It contains information that is legally privileged, confidential or otherwise protected from use or disclosure. If you are not the intended recipient, you are strictly prohibited from reviewing, disclosing, copying using or disseminating any of this information or taking any action in reliance on or regarding this information. If you have received this fax in error, please notify us immediately by telephone so that we can arrange for its return to us. Phone: [phone], Toll-Free: [phone], Fax: [phone] Page: 2 of 3 Call Id: [id number] Verbal Orders/Maintenance Medications Medication Refill Route Dosage Regime Duration Admin  Instructions User Name Dr Oliver BarreJames John  gave verbal order from this pt's chart to order Septra DS(800/160 mg) take 1 tablet by mouth bid for 7 days. Oral 1 tablet BID x 7 days Dr Oliver Barre gave verbal order from this pt's chart to order Septra DS(800/160 mg) take 1 tablet by mouth bid for 7 days. no refills. Nicholaus Bloom, RN, Myrene Buddy Comments User: Polo Riley, RN Date/Time Lamount Cohen Time): 03/26/2016 9:04:49 PM Caller informed: Dr Oliver Barre gave verbal order from this pt's chart to order Septra DS(800/160 mg) take 1 tablet by mouth bid for 7 days. Paging DoctorName Phone DateTime Result/Outcome Message Type Notes Oliver Barre- MD [phone] 03/26/2016 9:02:16 PM Called On Call Provider - Reached Doctor Paged Oliver Barre- MD 03/26/2016 9:03:26 PM Spoke with On Call - General Message Result Dr Oliver Barre gave verbal order from this pt's chart to order Septra DS(800/160 mg) take 1 tablet by mouth bid for 7 days. PLEASE NOTE: All timestamps contained within this report are represented as [place] Standard Time. CONFIDENTIALTY NOTICE: This fax transmission is intended only for the addressee. It contains information that is legally privileged, confidential or otherwise protected from use or disclosure. If you are not the intended recipient, you are strictly prohibited from reviewing, disclosing, copying using or disseminating any of this information or taking any action in reliance on or regarding this information. If you have received this fax in error, please notify us immediately by telephone so that we can arrange for its return to [place]. Phone: [phone], Toll-Free: [phone], Fax: [phone] Page: 3 of 3 Call Id: [phone] [facility] >>>Contains Verbal Order - Signature Required<<< [address], [address] [place], [place] [address] [phone] Fax: [phone] MEDICATION ORDER [facility] Night -  Client [facility] - Night Date: 03/26/2016 From: QI Department To: AA - PHYSICIAN, UNKNOWN- MD Please sign the order for the approved drug(s) given by our call center nurse on your behalf. Fax to [phone] within 5 business days. Thank you. Date Lamount Cohen Time): 03/26/2016 7:48:48 PM Triage RN: Polo Riley, RN NAME: Peter Clark PHONE NUMBER: [phone] (Primary) BIRTHDATE: 1951-05-18 ADDRESS: CITY/STATE/ZIP: [place] CALLER: Self NAME: Rx Given Medication Refill Route Dosage Regime Duration Admin Instructions User Name Dr Oliver Barre gave verbal order from this pt's chart to order Septra DS(800/160 mg) take 1 tablet by mouth bid for 7 days. Oral 1 tablet BID x 7 days Dr Oliver Barre gave verbal order from this pt's chart to order Septra DS(800/160 mg) take 1 tablet by mouth bid for 7 days. no refills. Nicholaus Bloom, RN, Myrene Buddy MD Signature Date

## 2016-03-27 NOTE — Telephone Encounter (Signed)
Pharmacy is calling with the concern of the Rx Bactrim wanting to know if the pt is being followed for his potassium b/c with the pt is currently taking lisinopril it will increase his potassium.  Wanted to know if the prescriber would like to change the antibiotic.

## 2016-03-27 NOTE — Telephone Encounter (Signed)
Per chart Dr Oliver BarreJames John has already changed Bactrim to Septra DS.   Dr Oliver BarreJames John gave verbal order from this pt's chart to order Septra DS(800/160 mg) take 1 tablet by mouth bid for 7 days. no refills. Nicholaus BloomKelley, RN, Myrene BuddyYvonne  This rx was called in to pt's pharmacy.  Nothing further needed.

## 2016-03-27 NOTE — Telephone Encounter (Signed)
noted 

## 2016-03-27 NOTE — Telephone Encounter (Signed)
Noted that this was completed by Dr. Jonny RuizJohn

## 2016-03-31 ENCOUNTER — Telehealth: Payer: Self-pay | Admitting: Internal Medicine

## 2016-03-31 NOTE — Telephone Encounter (Signed)
Spoke with pt and he states that he is still having pain/burning with urination. He reports that he has been taking abx as directed. Pt states that he is out of town in [place] and does not know when he will return to [place]. Advised pt that if he still has symptoms when he returns to town to please call office and schedule appt. Pt voiced understanding. Nothing further needed.

## 2016-03-31 NOTE — Telephone Encounter (Signed)
[facility] - Client TELEPHONE ADVICE RECORD [facility]  Patient Name: Peter Clark  DOB: 12-30-1950    Initial Comment caller states he is on Sulfamethoxazole and trimethoprim for a bladder infection but he is still having burning and frequent urination   Nurse Assessment  Nurse: Dorthula RuePatten, RN, Enrique SackKendra Date/Time (Eastern Time): 03/31/2016 12:08:22 PM  Confirm and document reason for call. If symptomatic, describe symptoms. You must click the next button to save text entered. ---Caller states he was started on antibiotic for bladder infection on 10/19. He states he is still urinating frequently and having burning. Denies any fever.  Has the patient traveled out of the country within the last 30 days? ---Not Applicable  Does the patient have any new or worsening symptoms? ---Yes  Will a triage be completed? ---Yes  Related visit to physician within the last 2 weeks? ---Yes  Does the PT have any chronic conditions? (i.e. diabetes, asthma, etc.) ---Yes  List chronic conditions. ---High Cholesterol  Is this a behavioral health or substance abuse call? ---No     Guidelines    Guideline Title Affirmed Question Affirmed Notes  Urinary Tract Infection on Antibiotic Follow-up Call - Male [1] Taking antibiotic > 72 hours (3 days) for UTI AND [2] painful urination or frequency not improved    Final Disposition User   See Physician within 24 Hours ArkabutlaPatten, RN, [employer]endra    Comments  Caller states he is in [place] right now and will see his local doctor there instead of this office   Referrals  GO TO FACILITY UNDECIDED   Disagree/Comply: Comply

## 2016-05-08 ENCOUNTER — Other Ambulatory Visit: Payer: Self-pay | Admitting: Internal Medicine

## 2016-05-08 NOTE — Telephone Encounter (Signed)
Patient called to follow up- he is advising the pharmacy told him we denied the request. advised that there is no response at this point. The patient advised that he is out as of today

## 2017-03-08 IMAGING — CR DG CHEST 2V
2 series · 2 of 2 positions shown · non-contrast
Comparison: None.

CLINICAL DATA: Shortness of breath for 2 months, history of
hypertension

EXAM:
CHEST  2 VIEW

[view not recorded (1 of 2)]
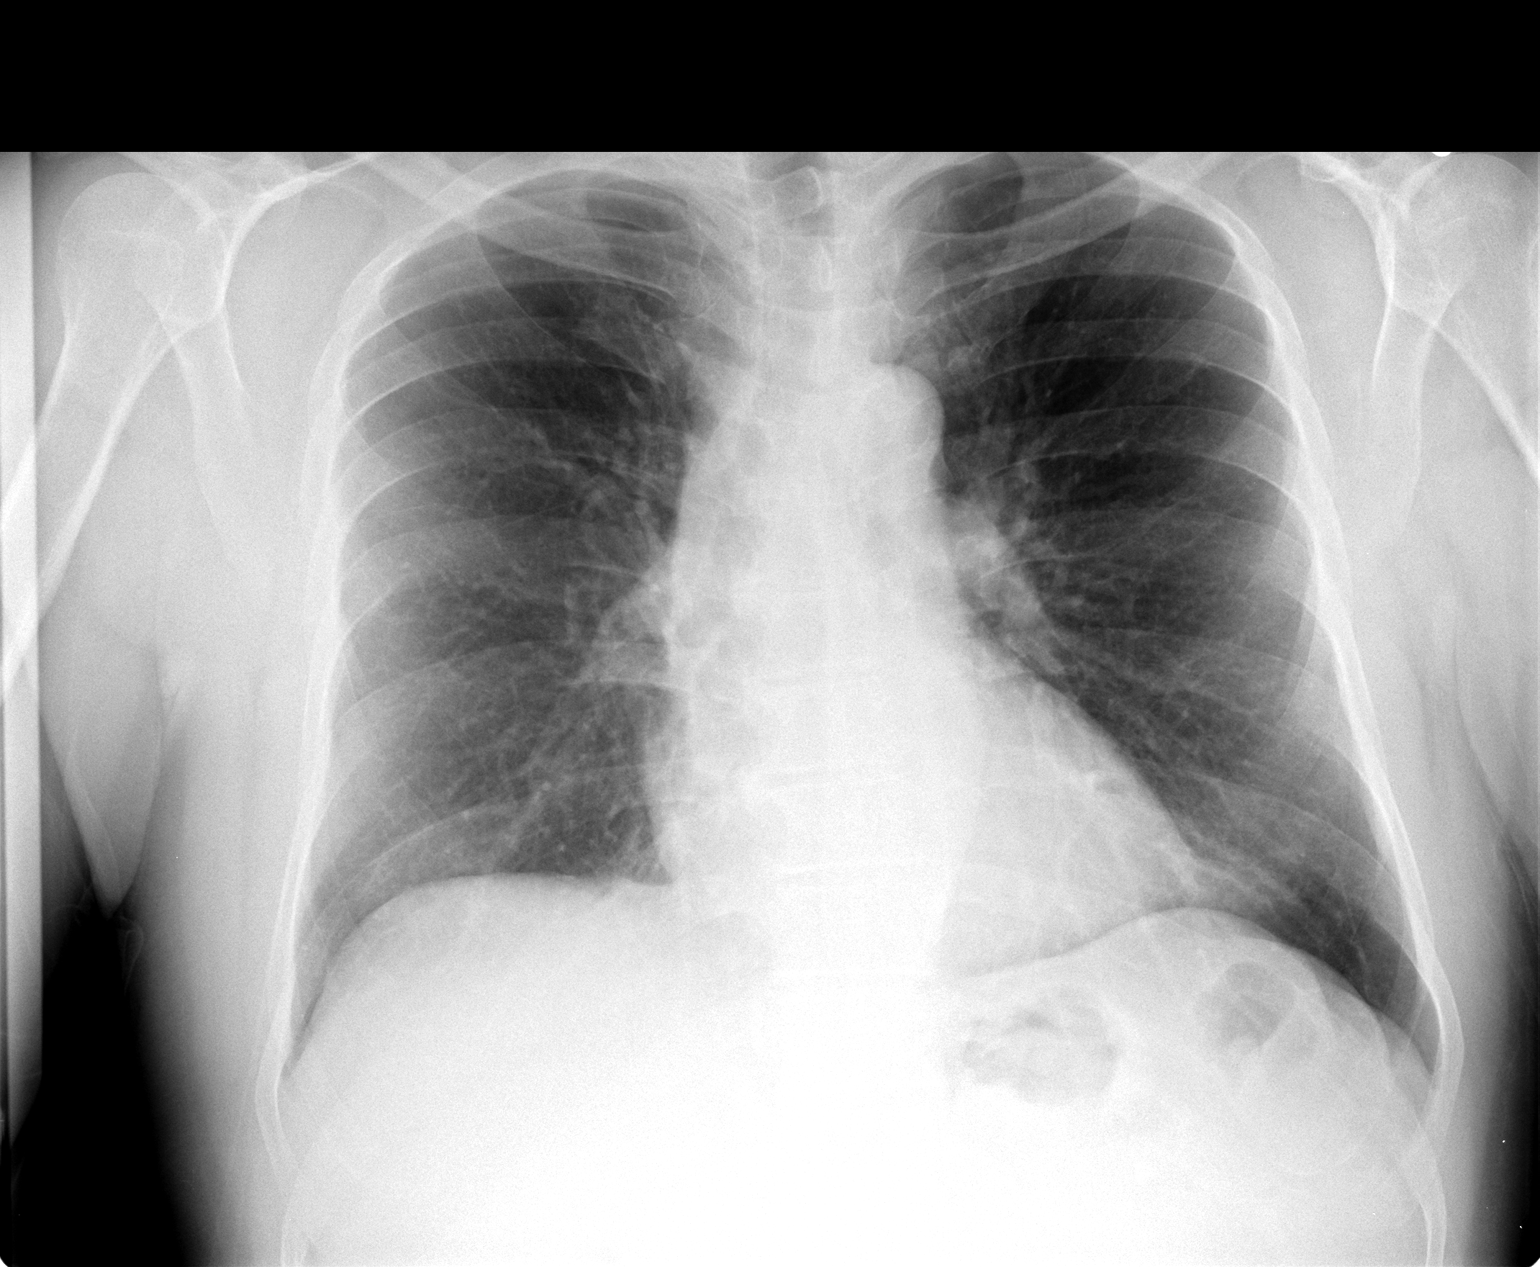

[view not recorded (2 of 2)]
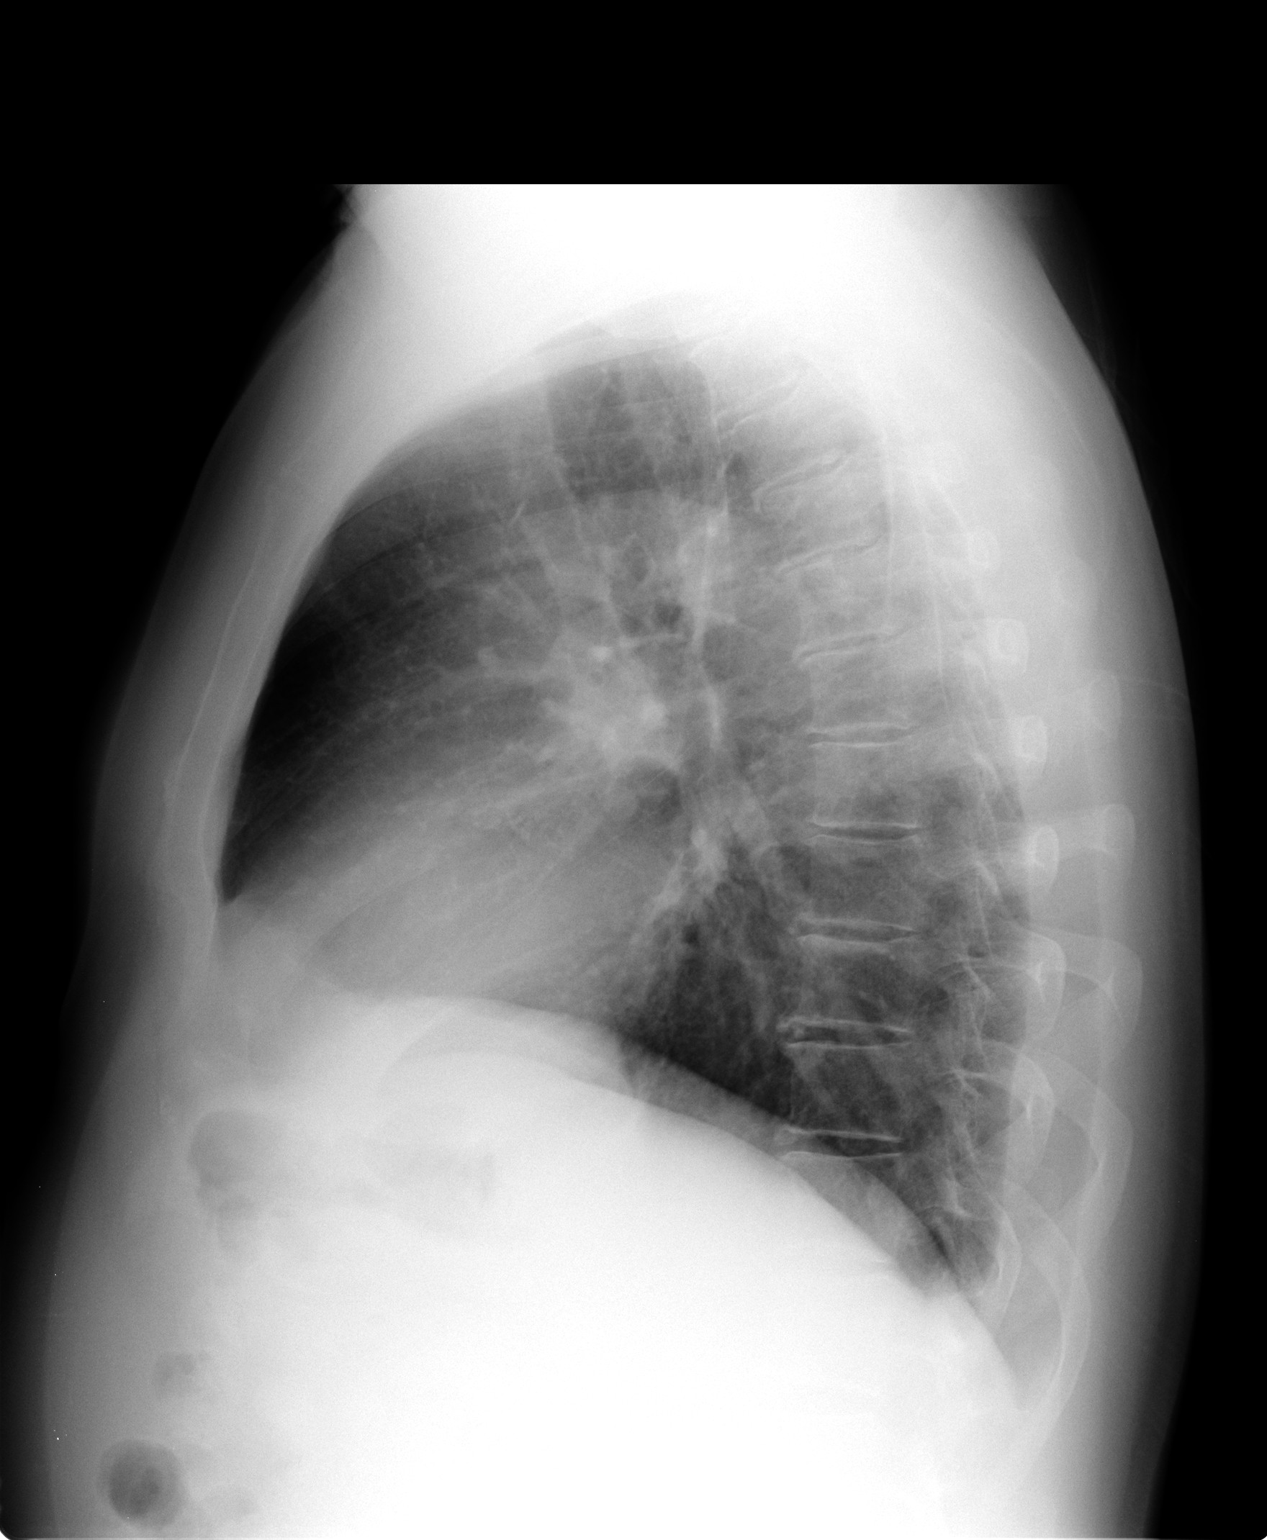

[2 of 2 positions shown; findings below may reference images not displayed]

FINDINGS: No active infiltrate or effusion is seen. Mediastinal and hilar
contours are unremarkable. The heart is within normal limits in
size. No bony abnormality is seen.
IMPRESSION: No active cardiopulmonary disease.
